# Patient Record
Sex: Female | Born: 1955 | Race: White | Hispanic: No | Smoking: Never smoker
Health system: Southern US, Community
[De-identification: ages and names within clinical notes are randomized; demographics above are authoritative.]

## PROBLEM LIST (undated history)

## (undated) DIAGNOSIS — M797 Fibromyalgia: Secondary | ICD-10-CM

## (undated) DIAGNOSIS — G4733 Obstructive sleep apnea (adult) (pediatric): Secondary | ICD-10-CM

## (undated) DIAGNOSIS — Z9989 Dependence on other enabling machines and devices: Secondary | ICD-10-CM

## (undated) DIAGNOSIS — A0472 Enterocolitis due to Clostridium difficile, not specified as recurrent: Secondary | ICD-10-CM

## (undated) DIAGNOSIS — M869 Osteomyelitis, unspecified: Secondary | ICD-10-CM

## (undated) HISTORY — PX: TOE DEBRIDEMENT: SHX1069

## (undated) HISTORY — PX: TEMPOROMANDIBULAR JOINT ARTHROPLASTY: SUR76

## (undated) HISTORY — PX: VAGINAL HYSTERECTOMY: SUR661

## (undated) HISTORY — PX: OTHER SURGICAL HISTORY: SHX169

## (undated) HISTORY — PX: BLADDER SUSPENSION: SHX72

## (undated) HISTORY — DX: Osteomyelitis, unspecified: M86.9

## (undated) HISTORY — DX: Enterocolitis due to Clostridium difficile, not specified as recurrent: A04.72

---

## 1986-01-23 HISTORY — PX: TMJ ARTHROPLASTY: SHX1066

## 2013-10-17 ENCOUNTER — Ambulatory Visit (INDEPENDENT_AMBULATORY_CARE_PROVIDER_SITE_OTHER): Payer: BC Managed Care – PPO | Admitting: Podiatry

## 2013-10-17 ENCOUNTER — Ambulatory Visit (INDEPENDENT_AMBULATORY_CARE_PROVIDER_SITE_OTHER): Payer: BC Managed Care – PPO

## 2013-10-17 ENCOUNTER — Encounter: Payer: Self-pay | Admitting: Podiatry

## 2013-10-17 VITALS — BP 111/69 | HR 60 | Resp 18

## 2013-10-17 DIAGNOSIS — R52 Pain, unspecified: Secondary | ICD-10-CM

## 2013-10-17 DIAGNOSIS — L089 Local infection of the skin and subcutaneous tissue, unspecified: Secondary | ICD-10-CM

## 2013-10-17 MED ORDER — CIPROFLOXACIN HCL 500 MG PO TABS: 500.0000 mg | ORAL_TABLET | Freq: Two times a day (BID) | ORAL | Status: DC

## 2013-10-17 MED ORDER — CLINDAMYCIN HCL 300 MG PO CAPS: 300.0000 mg | ORAL_CAPSULE | Freq: Three times a day (TID) | ORAL | Status: DC

## 2013-10-17 NOTE — Patient Instructions (Signed)
Monitor for any signs/symptoms of infection. Call the office immediately if any occur or go directly to the emergency room. Call with any questions/concerns. Continue antibiotics Get MRI, will call with results

## 2013-10-17 NOTE — Progress Notes (Signed)
   Subjective:    Patient ID: Carrie [name], female    DOB: 12-31-1955, 58 y.o.   MRN: [id number]  HPI Carrie [name], 58 year old female, presents to the office today with complaints of right hallux pain/infection. She states that approximately 9 weeks ago she was having pain on the inside aspect of her big toe on the right side. At that time she saw another podiatrist who performed a partial nail avulsion. A couple weeks after that she continued to have pain, swelling, redness. A repeat procedure was then performed. She states that over the last few weeks she has continued with redness, pain to the right big toe. She has been on 2 wound of antibiotics including Bactrim and doxycycline. She states that after the procedure she did have some purulent drainage. Also states that she had a "hole" on the inside aspect of her big toe which had some drainage. She states that while on antibiotics the redness, pain decreases but she comes off of the antibiotic he continues to become symptomatic. She has been soaking the foot as well as applying Neosporin to the site. Currently she denies any systemic complaints as fevers, chills, nausea, vomiting. No other complaints at this time.    Review of Systems  Skin: Positive for color change.       CHANGE IN NAILS  All other systems reviewed and are negative.      Objective:   Physical Exam AAO x3, NAD DP/PT pulses palpable 2/4 b/l. CRT < 3 sec Protective sensation intact with Simms Weinstein monofilament, vibratory sensation intact, Achilles tendon reflex intact. Right hallux status post partial nail avulsion with approximately 1/2 of the nail removed. There is mild edema to the digit with mild erythema from approximately the level of the IPJ distally.No fluctuance or crepitus. No ascending cellulitis. No drainage/purulece. Mild tenderness to palpation over the medial nail border and proximal nail border. Tenderness to palpation over the medial aspect of the hallux. No  open lesions. MMT 5/5, ROM No leg pain, swelling, warmth.     Assessment & Plan:  58 year old female with right hallux pain, persistent erythema status post partial nail avulsion. -X-rays were obtained and reviewed with the patient. See x-ray report for full details. There is some periosteal reaction on the medial aspect of the distal phalanx. There is a possible radiolucent line within the distal phalanx transversely at the level of the periosteal reaction. No prior x-ray to compare. -Conservative versus surgical treatment were discussed including alternatives, risks, complications. -Due to persistent pain, redness, edema with x-ray changes will obtain MRI to rule out osteomyelitis. -For now start clindamycin and ciprofloxacin - Monitor for any clinical signs or symptoms of worsening infection and directed to call the office immediately if any are to occur or go directly to the emergency room to -Followup after MRI. -Call with any questions, concerns, change in symptoms.

## 2013-10-31 ENCOUNTER — Telehealth: Payer: Self-pay | Admitting: *Deleted

## 2013-10-31 ENCOUNTER — Telehealth: Payer: Self-pay | Admitting: Podiatry

## 2013-10-31 NOTE — Telephone Encounter (Signed)
Attempted to call patient to discuss MRI results. Left message to call back. I will continue to try to reach her.

## 2013-10-31 NOTE — Telephone Encounter (Signed)
I am calling to see if I can get the results of my MRI that Dr. Ardelle AntonWagoner ordered.  Thank you.

## 2013-11-03 ENCOUNTER — Telehealth: Payer: Self-pay | Admitting: Podiatry

## 2013-11-03 NOTE — Telephone Encounter (Signed)
I called the patient on Saturday, 11/01/13 to discuss the results of the MRI. It does confirm osteomyelitis. Discussed with the patient the need for a bone biopsy. She is also at risk of amputation. Will schedule biopsy.   Today, 11/03/13, I spoke to Dr. Algis LimingVanDam, MD (Infectious Disease) in regards to the patient/MRI findings. Will discontinue antibiotics now, and schedule bone biopsy for next Monday, 11/10/13. Discussed with her that if she has signs/symptoms of infection to call immediately. Currently, she states her toe is stable however it fluctuates with some redness/warmth/pain, which corresponds to how much she is on it. [facility]. Patient to come in Wednesday, 11/05/13 at 4:30 to do consent. Will follow up with Infectious Disease after biopsy. Call sooner with any questions/concerns.

## 2013-11-03 NOTE — Telephone Encounter (Signed)
I'm calling about scheduling the biopsy of the toe.  Return my call, thank you.

## 2013-11-03 NOTE — Telephone Encounter (Signed)
I called and informed her that Dr. Ardelle AntonWagoner said he can do your surgery on Monday of next week.  He needs for you to schedule an appointment to come in for a consultation.  So if you can call tomorrow to schedule an appointment that would be great.  She stated, "Okay, I will."  I asked her if she has another number that we can reach her at because Dr.Wagoner had been trying to call you.  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

## 2013-11-03 NOTE — Telephone Encounter (Signed)
I need to schedule a biopsy on my toe as per Dr. Ardelle AntonWagoner.  Thank you.  I returned her call.  She stated, "I need to schedule my biopsy."  I asked her if it is going to be done here in the office or the surgical center.  She stated, "I don't know, he just told me he needed to do a biopsy because I have infection in my bone and he wanted to take a sample and based upon it, he may refer me to Infectious Disease.  So, I'd like to schedule it as soon as possible.  I told her I would have to check with Dr. Ardelle AntonWagoner to see where he would do the procedure, if it will be here or the surgical center and I would give her a call back.  She stated, "That is fine."

## 2013-11-05 ENCOUNTER — Encounter: Payer: Self-pay | Admitting: Podiatry

## 2013-11-05 ENCOUNTER — Ambulatory Visit (INDEPENDENT_AMBULATORY_CARE_PROVIDER_SITE_OTHER): Payer: BC Managed Care – PPO | Admitting: Podiatry

## 2013-11-05 VITALS — BP 111/59 | HR 63 | Resp 12

## 2013-11-05 DIAGNOSIS — M86171 Other acute osteomyelitis, right ankle and foot: Secondary | ICD-10-CM

## 2013-11-05 NOTE — Patient Instructions (Signed)
Pre-Operative Instructions  Congratulations, you have decided to take an important step to improving your quality of life.  You can be assured that the doctors of [facility] will be with you every step of the way.  1. Plan to be at the surgery center/hospital at least 1 (one) hour prior to your scheduled time unless otherwise directed by the surgical center/hospital staff.  You must have a responsible adult accompany you, remain during the surgery and drive you home.  Make sure you have directions to the surgical center/hospital and know how to get there on time. 2. For hospital based surgery you will need to obtain a history and physical form from your family physician within 1 month prior to the date of surgery- we will give you a form for you primary physician.  3. We make every effort to accommodate the date you request for surgery.  There are however, times where surgery dates or times have to be moved.  We will contact you as soon as possible if a change in schedule is required.   4. No Aspirin/Ibuprofen for one week before surgery.  If you are on aspirin, any non-steroidal anti-inflammatory medications (Mobic, Aleve, Ibuprofen) you should stop taking it 7 days prior to your surgery.  You make take Tylenol  For pain prior to surgery.  5. Medications- If you are taking daily heart and blood pressure medications, seizure, reflux, allergy, asthma, anxiety, pain or diabetes medications, make sure the surgery center/hospital is aware before the day of surgery so they may notify you which medications to take or avoid the day of surgery. 6. No food or drink after midnight the night before surgery unless directed otherwise by surgical center/hospital staff. 7. No alcoholic beverages 24 hours prior to surgery.  No smoking 24 hours prior to or 24 hours after surgery. 8. Wear loose pants or shorts- loose enough to fit over bandages, boots, and casts. 9. No slip on shoes, sneakers are best. 10. Bring  your boot with you to the surgery center/hospital.  Also bring crutches or a walker if your physician has prescribed it for you.  If you do not have this equipment, it will be provided for you after surgery. 11. If you have not been contracted by the surgery center/hospital by the day before your surgery, call to confirm the date and time of your surgery. 12. Leave-time from work may vary depending on the type of surgery you have.  Appropriate arrangements should be made prior to surgery with your employer. 13. Prescriptions will be provided immediately following surgery by your doctor.  Have these filled as soon as possible after surgery and take the medication as directed. 14. Remove nail polish on the operative foot. 15. Wash the night before surgery.  The night before surgery wash the foot and leg well with the antibacterial soap provided and water paying special attention to beneath the toenails and in between the toes.  Rinse thoroughly with water and dry well with a towel.  Perform this wash unless told not to do so by your physician.  Enclosed: 1 Ice pack (please put in freezer the night before surgery)   1 Hibiclens skin cleaner   Pre-op Instructions  If you have any questions regarding the instructions, do not hesitate to call our office.  [place]: [address] [place], [place] [address] [phone]  [place]: [address] [place], [place] [address] [phone]  [place]: [address]  [place], [place] [address] [phone]  Dr. Richard   Tuchman DPM, Dr. Norman Regal DPM Dr. Richard Sikora DPM, Dr. M. Todd Hyatt DPM, Dr. Kathryn Egerton DPM 

## 2013-11-06 ENCOUNTER — Encounter: Payer: Self-pay | Admitting: Podiatry

## 2013-11-06 ENCOUNTER — Telehealth: Payer: Self-pay | Admitting: *Deleted

## 2013-11-06 DIAGNOSIS — M86179 Other acute osteomyelitis, unspecified ankle and foot: Secondary | ICD-10-CM | POA: Insufficient documentation

## 2013-11-06 NOTE — Telephone Encounter (Signed)
I called and informed the patient that we were able to get her scheduled for surgery on Monday.  She stated, "I got an email from the surgical center that said to be there at 6 am.  So, I assume my surgery starts at 7 am."  I told her that is correct.

## 2013-11-06 NOTE — Telephone Encounter (Signed)
I called and spoke to Aram BeechamCynthia in regards to surgery.  Surgery will be for Monday at 7am at [facility].    I called and informed the patient.

## 2013-11-06 NOTE — Progress Notes (Signed)
Patient ID: Carrie [name], female   DOB: September 07, 1955, 58 y.o.   MRN: [id number]  Subjective: Carrie [name], 58 year old female, returns the office today for followup evaluation of right hallux pain, infection. She states that the redness and pain to the toe is intermittent. Since last appointment she did have an MRI performed of the right foot. She is also been continuing on clindamycin ciprofloxacin. She does state that she did finish the course of antibiotics approximately last Wednesday and has not been on antibiotics since. Today she does admit that she has been remodeling her house and she did step on a nail in July. She believes this is what started the infection. She denies any systemic complaints such as fevers, chills, nausea, vomiting. Denies any acute changes since last appointment. No other complaints at this time.  Objective: AAO x3, NAD DP/PT pulses palpable bilaterally, CRT less than 3 seconds Protective sensation intact with Simms Weinstein monofilament, vibratory sensation intact, Achilles tendon reflex intact. Tenderness to palpation of the right hallux mostly over the medial aspect distally. There is localized erythema over the digit starting approximately level of the IPJ distally. There is no streaking. Is no fluctuance, crepitus. No open lesions. No purulence or drainage identified. No calf pain, swelling, warmth. MMT 5/5, ROM WNL  MRI 10/25/13 (preformed at [facility])- Marrow edema throughout the distal phalanx of the great toe is consistent with osteomyelitis. There appears to be a skin ulceration along the medial aspect of the distal phalanx. Negative for abscess.  Assessment: 58 year old female with right hallux continued pain and erythema with x-rays and MRI concerning for osteomyelitis of the distal phalanx.  Plan: -Conservative versus surgical treatment were discussed including alternatives, risks, complications. -At this time given the MRI and x-ray findings consistent  with osteomyelitis recommend bone biopsy for definitive diagnosis and to help determine antibiotic treatment. I did discuss this case with Dr. Algis LimingVanDam of infectious disease. Once bone biopsy results are obtained we'll refer to infectious disease. -Discussed the surgery in detail with the patient including alternatives, risks, complications. I also discussed with her the postoperative course. Risks of performing a bone biopsy include further infection bleeding, pain, swelling, need for further surgery, painful or ugly scar, numbness or sensation changes, blood clots, fracture of the bone. I also discussed with the patient she is at possibility of toe amputation. At this time the patient wishes to proceed with anything to help prevent amputation. -Surgery will be performed on either Monday, October 19 or Tuesday, October 20 pending scheduling of the surgery center. [facility].  -Will continue to hold off on antibiotics this time due to bone biopsy. In the meantime if the area becomes more swollen, red, streaking or any systemic complaints to call the office.  -Patient followup 1 week after surgery. In the meantime call the office with any questions, concerns, change in symptoms.

## 2013-11-07 ENCOUNTER — Telehealth: Payer: Self-pay | Admitting: *Deleted

## 2013-11-07 NOTE — Telephone Encounter (Signed)
I called the patient back. I told her to go ahead and make the appointment with ID. I told her we may not have the results back by then but if they don't have an opening for a couple more weeks, it is best to go ahead and be seen. She will schedule for Wednesday with Dr. Daiva EvesVan Dam.

## 2013-11-07 NOTE — Telephone Encounter (Signed)
I'm scheduled for a bone biopsy on Monday.  I was called from Dr. Zenaida NieceVan Dam's Infectious Disease office, about an appointment on Wednesday.  I'm trying to find out if test results will be back for that to be a productive visit.  If someone would please give me a call back.

## 2013-11-10 ENCOUNTER — Encounter: Payer: Self-pay | Admitting: Podiatry

## 2013-11-10 DIAGNOSIS — M86171 Other acute osteomyelitis, right ankle and foot: Secondary | ICD-10-CM

## 2013-11-10 NOTE — Telephone Encounter (Signed)
I called and left her a message that Dr. Ardelle AntonWagoner said to go ahead and keep the appointment for Wednesday with infectious disease.  If you have any questions, give me a call back.

## 2013-11-12 ENCOUNTER — Encounter: Payer: Self-pay | Admitting: Infectious Disease

## 2013-11-12 ENCOUNTER — Ambulatory Visit (INDEPENDENT_AMBULATORY_CARE_PROVIDER_SITE_OTHER): Payer: BC Managed Care – PPO | Admitting: Infectious Disease

## 2013-11-12 ENCOUNTER — Telehealth: Payer: Self-pay | Admitting: Podiatry

## 2013-11-12 VITALS — BP 125/80 | HR 82 | Temp 98.4°F | Wt 136.5 lb

## 2013-11-12 DIAGNOSIS — Z23 Encounter for immunization: Secondary | ICD-10-CM | POA: Diagnosis not present

## 2013-11-12 DIAGNOSIS — Z113 Encounter for screening for infections with a predominantly sexual mode of transmission: Secondary | ICD-10-CM | POA: Diagnosis not present

## 2013-11-12 DIAGNOSIS — M86172 Other acute osteomyelitis, left ankle and foot: Secondary | ICD-10-CM | POA: Diagnosis not present

## 2013-11-12 LAB — BASIC METABOLIC PANEL WITH GFR
BUN: 9 mg/dL (ref 6–23)
CO2: 26 mEq/L (ref 19–32)
Calcium: 9.9 mg/dL (ref 8.4–10.5)
Chloride: 105 mEq/L (ref 96–112)
Creat: 0.78 mg/dL (ref 0.50–1.10)
GFR, EST NON AFRICAN AMERICAN: 85 mL/min
Glucose, Bld: 80 mg/dL (ref 70–99)
POTASSIUM: 4.1 meq/L (ref 3.5–5.3)
SODIUM: 143 meq/L (ref 135–145)

## 2013-11-12 LAB — CBC WITH DIFFERENTIAL/PLATELET
Basophils Absolute: 0 10*3/uL (ref 0.0–0.1)
Basophils Relative: 0 % (ref 0–1)
EOS ABS: 0.1 10*3/uL (ref 0.0–0.7)
EOS PCT: 1 % (ref 0–5)
HEMATOCRIT: 40.1 % (ref 36.0–46.0)
HEMOGLOBIN: 13.5 g/dL (ref 12.0–15.0)
Lymphocytes Relative: 20 % (ref 12–46)
Lymphs Abs: 1.5 10*3/uL (ref 0.7–4.0)
MCH: 29.2 pg (ref 26.0–34.0)
MCHC: 33.7 g/dL (ref 30.0–36.0)
MCV: 86.6 fL (ref 78.0–100.0)
MONO ABS: 0.5 10*3/uL (ref 0.1–1.0)
MONOS PCT: 6 % (ref 3–12)
NEUTROS ABS: 5.5 10*3/uL (ref 1.7–7.7)
Neutrophils Relative %: 73 % (ref 43–77)
Platelets: 279 10*3/uL (ref 150–400)
RBC: 4.63 MIL/uL (ref 3.87–5.11)
RDW: 14 % (ref 11.5–15.5)
WBC: 7.6 10*3/uL (ref 4.0–10.5)

## 2013-11-12 LAB — C-REACTIVE PROTEIN: CRP: <0.5 mg/dL (ref <0.60)

## 2013-11-12 LAB — HEPATITIS PANEL, ACUTE
HCV Ab: NEGATIVE
HEP A IGM: NONREACTIVE
Hep B C IgM: NONREACTIVE
Hepatitis B Surface Ag: NEGATIVE

## 2013-11-12 NOTE — Patient Instructions (Signed)
We are trying to track down cultures that may or may not have been done this past Friday  IF no cultures were obtained options:  #1 you can come off the antibiotics again and Dr Ardelle AntonWagoner can obtain fresh cultures 2 weeks from stopping them  Or  #2 we can place PICC line and start an "EMPIRIC' regimen for you that is NOT targetted  I have put call into your surgeon  Lets get blood work today, keep appt with your surgeon for Friday and we can then make game plan from there

## 2013-11-12 NOTE — Telephone Encounter (Signed)
Call patient to followup with her after her surgery on Monday. She states that she is feeling tired, but overall okay. No questions at this time.   She saw Dr. Daiva EvesVan Dam today.   There was a question about the results of the specimens and I called the [facility] and they confirmed they were sent to [facility]. I was told we should hopefully have results by the end of the week. Will send the results once they are received.

## 2013-11-12 NOTE — Progress Notes (Signed)
Subjective:    Patient ID: Carrie [name], female    DOB: March 29, 1955, 58 y.o.   MRN: [id number]  HPI  58 year old with toe injury (not sure mechanism thinks it was due to carpet tack maybe?). Was sore on outside. And underneath, toenail swollen. Turned red. Pt cut nail, went back, went to Dr. Hal Neerillis on August 17th.  He cut out toenail and put pt on abx , took for 7 days. Infection was worse. He removed nail more. Pt started bactrim DS BID. Pt went in and was not much improved. Pt "messed with scab pus and blood came out. Called [facility] back. Bactrim DS bid again which helped but recurred again off of the bactrim. Went on the 21st of September. 2 days later much worse. Pt then went to see Dr. Ardelle AntonWagoner. Dr. Ardelle AntonWagoner saw her did plain films suspected osteomyelitis and started cipro and clindamycin.  Pt had MRI in October which showed bone Marrow edema throughout the distal phalanx of the great toe is consistent with osteomyelitis Pt was off the clinda and cipro for nearly 2 weeks. Had surgery on Monday. Back on cipro and clindamycin.  Today initially when talked to Dr. Gabriel RungWagoner's clinic it appeared that cultures potential he might not have been sent. However I called Dr. Loreta AveWagner on his cell phone he said indeed culture and pathological exam specimens had indeed been sent but 2 different reference labs than [facility]      Review of Systems  Constitutional: Negative for chills, activity change and appetite change.  HENT: Negative for hearing loss, mouth sores, sinus pressure and sore throat.   Eyes: Negative for photophobia and visual disturbance.  Respiratory: Negative for choking, chest tightness, shortness of breath and wheezing.   Gastrointestinal: Negative for abdominal pain, constipation and abdominal distention.  Endocrine: Negative for cold intolerance.  Genitourinary: Negative for dysuria, flank pain and difficulty urinating.  Musculoskeletal: Negative for arthralgias and back pain.  Skin: Positive  for wound.  Neurological: Negative for dizziness and headaches.  Hematological: Negative for adenopathy. Does not bruise/bleed easily.  Psychiatric/Behavioral: Negative for behavioral problems, confusion, dysphoric mood, decreased concentration and agitation.       Objective:   Physical Exam  Constitutional: She appears well-developed and well-nourished.  HENT:  Head: Normocephalic and atraumatic.  Eyes: Conjunctivae and EOM are normal. No scleral icterus.  Neck: Normal range of motion. Neck supple.  Cardiovascular: Normal rate and regular rhythm.   Pulmonary/Chest: Effort normal. No respiratory distress. She has no wheezes.  Abdominal: Soft. She exhibits no distension. There is no tenderness.  Musculoskeletal: Normal range of motion. She exhibits no edema.  Neurological: She is alert.  Skin: She is not diaphoretic.  Psychiatric: She has a normal mood and affect. Her behavior is normal. Judgment and thought content normal.          Assessment & Plan:  Osteomyelitis of toe: Followup cultures done at Dr. Gabriel RungWagoner's office. Hopefully we can then target an organism with IV antibiotics if the organisms grown we will have to try an enteric regimen regimen we'll plan for 6 weeks' time. Will check basic labs today and also check sedimentation rate C-reactive protein as well as screening her for HIV and hepatitis panel. I spent greater than 45 minutes with the patient including greater than 50% of time in face to face counsel of the patient and in coordination of their care.   Screening we'll screen her for intravenous C. and other viral entities although she's not a  high risk mammographic   influenza shot provided

## 2013-11-13 LAB — HIV ANTIBODY (ROUTINE TESTING W REFLEX): HIV 1&2 Ab, 4th Generation: NONREACTIVE

## 2013-11-14 ENCOUNTER — Ambulatory Visit (INDEPENDENT_AMBULATORY_CARE_PROVIDER_SITE_OTHER): Payer: BC Managed Care – PPO | Admitting: Podiatry

## 2013-11-14 ENCOUNTER — Ambulatory Visit (INDEPENDENT_AMBULATORY_CARE_PROVIDER_SITE_OTHER): Payer: BC Managed Care – PPO

## 2013-11-14 ENCOUNTER — Encounter: Payer: Self-pay | Admitting: Podiatry

## 2013-11-14 VITALS — BP 119/69 | HR 65 | Temp 98.5°F | Resp 16

## 2013-11-14 DIAGNOSIS — M86171 Other acute osteomyelitis, right ankle and foot: Secondary | ICD-10-CM

## 2013-11-14 NOTE — Progress Notes (Signed)
Dr Ardelle AntonWagoner performed a right hallux bone biopsy on 11/10/13

## 2013-11-17 NOTE — Progress Notes (Signed)
Patient ID: Armen Pickupvelyn Cutrone, female   DOB: 06-03-1955, 58 y.o.   MRN: [id number]  Subjective: Ms. Ala DachFord returns to the office today 4 days status post right hallux bone biopsy for suspected osteomyelitis. She denies any systemic complaints of fevers, chills, nausea, vomiting. She has recently been evaluated by infectious disease. She is continued on clindamycin and ciprofloxacin pending results of the bone biopsy. She denies any acute changes of as appointed. Other complaints this time.  Objective: AAO x3, NAD DP/PT pulses palpable bilaterally, CRT less than 3 seconds Protective sensation intact with Simms Weinstein monofilament, vibratory sensation intact, Achilles tendon reflex intact Mild erythema to the distal aspect of the right hallux to the level of the IPJ. This is the area which has been unchanged since surgery. There is no streaking present. No areas of fluctuance or crepitus. Incision on the medial aspect as well as the distal aspect of the right hallux WITHOUT any evidence of dehiscence and sutures intact. No drainage. Mild tenderness strictly over the surgical site. Mild edema. No calf pain, swelling, warmth, erythema.  Assessment: 58 year old female 4 days status post bone biopsy right hallux for suspected osteomyelitis  Plan: -X-Rays were obtained and reviewed with the patient. -Treatment options discussed including alternatives, risks, complications. -Betadine painted over the incision followed by dry sterile dressing. Discussed with the patient that she can change the bandage at home to monitor the erythema and for any signs or symptoms of worsening infection. I discussed with her how to do this. Continue with surgical shoe. Discussed not to try with surgical shoe. -Follow-up with infectious disease for antibiotics. Will forward the results to Dr. Algis LimingVanDam once obtained.  -Follow-up in 2 weeks or sooner if there are any questions, concerns, any change in symptoms. At that time sutures  will be removed.

## 2013-11-21 ENCOUNTER — Telehealth: Payer: Self-pay | Admitting: Infectious Disease

## 2013-11-21 NOTE — Telephone Encounter (Signed)
Received faxed micro data from Dr. Ardelle AntonWagoner.  No growth on her back or in her gait plates pathology itself did not show osteomyelitis the MRI data.  Will need to talk to her early next week about if she would like to proceed with insertion of PICC line and aggressive course of IV antibiotics as we originally  Carrie [name] can you call her on Monday?

## 2013-11-23 HISTORY — PX: PERIPHERALLY INSERTED CENTRAL CATHETER INSERTION: SHX2221

## 2013-11-24 NOTE — Telephone Encounter (Signed)
She has an appointment with Dr. Ardelle AntonWagoner on Friday to have stitches removed, she will talk to him then.

## 2013-11-24 NOTE — Telephone Encounter (Signed)
If we were going to go with oral antibiotics then I think something to cover MRSA, Coag neg staph such as doxycyline 100mg  twice daily along with perhaps a fluoroquinolone such as Levaquin at 750mg  daily for at least 4 if not 6 weeks

## 2013-11-24 NOTE — Telephone Encounter (Signed)
I will see her this week and talk to her about it. If she does not want a PICC, what oral abx would you suggest? I had put her on clinda/cipro immediately after the biopsy. I did call her last week to let her know about the results but did not discuss treatment. I am surprised that she wanted to see what happens off of antibiotics because that was her complaint at first was when she was off antibiotics her toe would become red and swollen. I will discuss with her this week and let you know. Thanks for the update.

## 2013-11-24 NOTE — Telephone Encounter (Signed)
Patient would like to see if symptoms get better without antibiotics first and she has f/u appt with you on 12/21.

## 2013-11-24 NOTE — Telephone Encounter (Signed)
I do not think that is a good idea AT ALL.  I think she at minimum should be taking oral antibiotics. Is she at least taking those?  Also want to make sure Dr. Ardelle AntonWagoner is aware of what is going on here. Sounds like she may need a visit to review and discuss

## 2013-11-24 NOTE — Telephone Encounter (Signed)
I hope she is taking SOME of the oral antibiotics she was on when I last saw her

## 2013-11-26 ENCOUNTER — Encounter: Payer: Self-pay | Admitting: Podiatry

## 2013-11-26 ENCOUNTER — Ambulatory Visit (INDEPENDENT_AMBULATORY_CARE_PROVIDER_SITE_OTHER): Payer: BC Managed Care – PPO | Admitting: Podiatry

## 2013-11-26 VITALS — BP 119/59 | HR 60 | Temp 97.9°F | Resp 12

## 2013-11-26 DIAGNOSIS — M86171 Other acute osteomyelitis, right ankle and foot: Secondary | ICD-10-CM

## 2013-11-26 DIAGNOSIS — Z09 Encounter for follow-up examination after completed treatment for conditions other than malignant neoplasm: Secondary | ICD-10-CM

## 2013-11-26 NOTE — Progress Notes (Signed)
Patient ID: Armen Pickupvelyn Specht, female   DOB: [birth year], 58 y.o.   MRN: [id number]  Subjective: Ms. Ala DachFord returns the office today 2 weeks status post right hallux bone biopsy for suspected osteomyelitis. She states that she is continuing to have some pain overlying the area in the same spot that she had prior. She does that she finished her antibiotics which were clindamycin and ciprofloxacin on Saturday and has not been on any antibiotics since then. She states that on Sunday and she did notice that her toe became more red, swollen. She states that she was wearing to see what happens to her toe off of antibiotics before proceeding with any further treatment. She denies any recent injury or trauma to the area. She denies any systemic complaints as fevers, chills, nausea, vomiting. No acute changes since last appointment. No other complaints at this time.  Objective: AAO x3, NAD DP/PT pulses palpable bilaterally, CRT less than 3 seconds Protective sensation intact with Simms Weinstein monofilament, vibratory sensation intact Incision on the distal as well as the medial aspect of the right hallux is well coapted without any evidence of dehiscence and sutures intact. There continues to be mild erythema over the distal aspect of the right hallux starting at the level of the IPJ which is been consistent since prior to surgery. There is no significant increase in warmth over the area. There is mild overlying edema. There is no drainage appearance identified. No open lesions. No pain with range of motion at the MPJ. Mild tenderness over the medial aspect of the distal phalanx. No calf pain, swelling, warmth, erythema.  Assessment: 58 year old female continued right hallux pain, swelling, erythema  Plan: Treatment options were discussed with the patient including alternatives, risks, complications. She states she wanted to see what happened while off the antibiotics. Also discussed with her the results of the biopsy  as well as the culture data.I discussed with her in great detail that she is at risk of losing at least portion of of the toe due to the suspected osteomyelitis, and if she goes without treatment there is risk of the infection spreading and that she can lose all of the toe or even more proximal. She states that she went to see how the toe looked today before starting any further antibiotics. She does state that today the toe does continue to be red, swollen, painful and she wishes to proceed with antibiotics at this time. I did discuss with her various options and she has elected to proceed with a PICC line and intravenous antibiotics as she has previous been on oral antibiotics without any resolution in symptoms. I'll discuss this with Dr. Daiva EvesVan Dam. -Sutures are removed today. Steri-Strips applied. Discussed with the patient she can start to get the area wet however do not soak the foot.Marland Kitchen. Apply a small amount of antibiotic ointment over the incision followed by a light dressing. Continue with surgical shoe until next appointment. -follow-up in 2 weeks or sooner if any problems are to arise or any change in symptoms. Will re-x-ray at next appointment

## 2013-11-27 ENCOUNTER — Other Ambulatory Visit: Payer: Self-pay | Admitting: Licensed Clinical Social Worker

## 2013-11-27 DIAGNOSIS — M86172 Other acute osteomyelitis, left ankle and foot: Secondary | ICD-10-CM

## 2013-11-27 NOTE — Progress Notes (Signed)
Order placed in epic,[facility] just waiting on phone call back with time and date

## 2013-11-28 ENCOUNTER — Telehealth (HOSPITAL_COMMUNITY): Payer: Self-pay | Admitting: Interventional Radiology

## 2013-11-28 NOTE — Telephone Encounter (Signed)
Called pt, left VM for her to call to schedule PICC placement JM

## 2013-12-01 NOTE — Progress Notes (Signed)
I set this patient for this Wednesday for PICC Placement and [facility] has been notified to furnish antibiotics

## 2013-12-02 ENCOUNTER — Other Ambulatory Visit: Payer: Self-pay | Admitting: Licensed Clinical Social Worker

## 2013-12-02 DIAGNOSIS — M869 Osteomyelitis, unspecified: Secondary | ICD-10-CM

## 2013-12-02 MED ORDER — DEXTROSE 5 % IV SOLN: 2.0000 g | Freq: Once | INTRAVENOUS | Status: DC

## 2013-12-02 MED ORDER — VANCOMYCIN HCL 10 G IV SOLR: 1250.0000 mg | Freq: Once | INTRAVENOUS | Status: DC

## 2013-12-03 ENCOUNTER — Ambulatory Visit (HOSPITAL_COMMUNITY)
Admission: RE | Admit: 2013-12-03 | Discharge: 2013-12-03 | Disposition: A | Discharge status: Home / Self Care | Payer: BC Managed Care – PPO | Source: Ambulatory Visit | Attending: Infectious Disease | Admitting: Infectious Disease

## 2013-12-03 ENCOUNTER — Other Ambulatory Visit: Payer: Self-pay | Admitting: Infectious Disease

## 2013-12-03 DIAGNOSIS — M86172 Other acute osteomyelitis, left ankle and foot: Secondary | ICD-10-CM

## 2013-12-03 DIAGNOSIS — M869 Osteomyelitis, unspecified: Secondary | ICD-10-CM | POA: Insufficient documentation

## 2013-12-03 MED ORDER — HEPARIN SOD (PORK) LOCK FLUSH 100 UNIT/ML IV SOLN
INTRAVENOUS | Status: AC
  Filled 2013-12-03: qty 5

## 2013-12-03 MED ORDER — LIDOCAINE HCL 1 % IJ SOLN
INTRAMUSCULAR | Status: AC
  Filled 2013-12-03: qty 20

## 2013-12-03 NOTE — Procedures (Signed)
Interventional Radiology Procedure Note  Procedure: Placement of a right brachial vein PICC.  Single lumen.  35cm. Tip at cavoatrial junction. Complications: No immediate Recommendations:  - Ok to shower tomorrow - Do not submerge for 7 days - Routine line care   Signed,  Yvone NeuJaime S. Loreta AveWagner, DO

## 2013-12-04 ENCOUNTER — Other Ambulatory Visit: Payer: Self-pay | Admitting: Licensed Clinical Social Worker

## 2013-12-04 ENCOUNTER — Non-Acute Institutional Stay (HOSPITAL_COMMUNITY)
Admission: AD | Admit: 2013-12-04 | Discharge: 2013-12-04 | Disposition: A | Discharge status: Home / Self Care | Payer: BC Managed Care – PPO | Source: Ambulatory Visit | Attending: Infectious Disease | Admitting: Infectious Disease

## 2013-12-04 ENCOUNTER — Encounter (HOSPITAL_COMMUNITY): Payer: BC Managed Care – PPO

## 2013-12-04 DIAGNOSIS — M86179 Other acute osteomyelitis, unspecified ankle and foot: Secondary | ICD-10-CM | POA: Diagnosis not present

## 2013-12-04 MED ORDER — VANCOMYCIN HCL 10 G IV SOLR
1250.0000 mg | INTRAVENOUS | Status: DC
  Administered 2013-12-04: 1250 mg via INTRAVENOUS
  Filled 2013-12-04 (×2): qty 1250

## 2013-12-04 MED ORDER — CEFTRIAXONE SODIUM IN DEXTROSE 40 MG/ML IV SOLN
2.0000 g | INTRAVENOUS | Status: DC
Start: 2013-12-04 — End: 2013-12-05
  Administered 2013-12-04: 2 g via INTRAVENOUS
  Filled 2013-12-04 (×2): qty 50

## 2013-12-04 MED ORDER — SODIUM CHLORIDE 0.9 % IJ SOLN
10.0000 mL | INTRAMUSCULAR | Status: AC | PRN
  Administered 2013-12-04: 10 mL

## 2013-12-04 MED ORDER — HEPARIN SOD (PORK) LOCK FLUSH 100 UNIT/ML IV SOLN
250.0000 [IU] | INTRAVENOUS | Status: AC | PRN
  Administered 2013-12-04: 250 [IU]
  Filled 2013-12-04: qty 5

## 2013-12-04 NOTE — Progress Notes (Signed)
Pt arrived for antibiotics transfusion x2; a/o; PICC accessed; antibiotics transfused per order; Pt tolerated well; Stable at discharge; PICC heparin locked; Discharged to home; Osvaldo HumanPaolillo, Anayia Eugene,RN

## 2013-12-09 ENCOUNTER — Encounter (HOSPITAL_COMMUNITY): Payer: Self-pay | Admitting: Emergency Medicine

## 2013-12-09 ENCOUNTER — Inpatient Hospital Stay (HOSPITAL_COMMUNITY)
Admission: EM | Admit: 2013-12-09 | Discharge: 2013-12-13 | DRG: 872 | Disposition: A | Discharge status: Home Health | Payer: BC Managed Care – PPO | Attending: Internal Medicine | Admitting: Internal Medicine

## 2013-12-09 DIAGNOSIS — A047 Enterocolitis due to Clostridium difficile: Secondary | ICD-10-CM | POA: Diagnosis present

## 2013-12-09 DIAGNOSIS — Z9889 Other specified postprocedural states: Secondary | ICD-10-CM

## 2013-12-09 DIAGNOSIS — G4733 Obstructive sleep apnea (adult) (pediatric): Secondary | ICD-10-CM | POA: Diagnosis present

## 2013-12-09 DIAGNOSIS — M791 Myalgia: Secondary | ICD-10-CM | POA: Diagnosis present

## 2013-12-09 DIAGNOSIS — R51 Headache: Secondary | ICD-10-CM | POA: Diagnosis present

## 2013-12-09 DIAGNOSIS — L0390 Cellulitis, unspecified: Secondary | ICD-10-CM | POA: Diagnosis present

## 2013-12-09 DIAGNOSIS — A414 Sepsis due to anaerobes: Secondary | ICD-10-CM | POA: Diagnosis not present

## 2013-12-09 DIAGNOSIS — Z888 Allergy status to other drugs, medicaments and biological substances status: Secondary | ICD-10-CM

## 2013-12-09 DIAGNOSIS — A0472 Enterocolitis due to Clostridium difficile, not specified as recurrent: Secondary | ICD-10-CM | POA: Insufficient documentation

## 2013-12-09 DIAGNOSIS — M86672 Other chronic osteomyelitis, left ankle and foot: Secondary | ICD-10-CM | POA: Diagnosis present

## 2013-12-09 DIAGNOSIS — R509 Fever, unspecified: Secondary | ICD-10-CM | POA: Diagnosis present

## 2013-12-09 DIAGNOSIS — B999 Unspecified infectious disease: Secondary | ICD-10-CM

## 2013-12-09 DIAGNOSIS — A419 Sepsis, unspecified organism: Secondary | ICD-10-CM | POA: Diagnosis not present

## 2013-12-09 DIAGNOSIS — M869 Osteomyelitis, unspecified: Secondary | ICD-10-CM | POA: Diagnosis present

## 2013-12-09 DIAGNOSIS — Z792 Long term (current) use of antibiotics: Secondary | ICD-10-CM

## 2013-12-09 DIAGNOSIS — I959 Hypotension, unspecified: Secondary | ICD-10-CM | POA: Diagnosis present

## 2013-12-09 DIAGNOSIS — Z79899 Other long term (current) drug therapy: Secondary | ICD-10-CM

## 2013-12-09 HISTORY — DX: Obstructive sleep apnea (adult) (pediatric): G47.33

## 2013-12-09 HISTORY — DX: Fibromyalgia: M79.7

## 2013-12-09 HISTORY — DX: Dependence on other enabling machines and devices: Z99.89

## 2013-12-09 LAB — CBC WITH DIFFERENTIAL/PLATELET
BASOS ABS: 0 10*3/uL (ref 0.0–0.1)
BASOS PCT: 0 % (ref 0–1)
EOS ABS: 0 10*3/uL (ref 0.0–0.7)
Eosinophils Relative: 0 % (ref 0–5)
HCT: 34 % — ABNORMAL LOW (ref 36.0–46.0)
Hemoglobin: 11.4 g/dL — ABNORMAL LOW (ref 12.0–15.0)
Lymphocytes Relative: 6 % — ABNORMAL LOW (ref 12–46)
Lymphs Abs: 0.6 10*3/uL — ABNORMAL LOW (ref 0.7–4.0)
MCH: 28.9 pg (ref 26.0–34.0)
MCHC: 33.5 g/dL (ref 30.0–36.0)
MCV: 86.3 fL (ref 78.0–100.0)
MONO ABS: 0.8 10*3/uL (ref 0.1–1.0)
Monocytes Relative: 7 % (ref 3–12)
Neutro Abs: 9 10*3/uL — ABNORMAL HIGH (ref 1.7–7.7)
Neutrophils Relative %: 87 % — ABNORMAL HIGH (ref 43–77)
Platelets: 179 10*3/uL (ref 150–400)
RBC: 3.94 MIL/uL (ref 3.87–5.11)
RDW: 13.1 % (ref 11.5–15.5)
WBC: 10.4 10*3/uL (ref 4.0–10.5)

## 2013-12-09 LAB — I-STAT CG4 LACTIC ACID, ED: Lactic Acid, Venous: 1 mmol/L (ref 0.5–2.2)

## 2013-12-09 MED ORDER — ACETAMINOPHEN 325 MG PO TABS
650.0000 mg | ORAL_TABLET | Freq: Four times a day (QID) | ORAL | Status: DC | PRN
  Administered 2013-12-09 – 2013-12-10 (×2): 650 mg via ORAL
  Filled 2013-12-09 (×2): qty 2

## 2013-12-09 MED ORDER — ONDANSETRON 4 MG PO TBDP
8.0000 mg | ORAL_TABLET | Freq: Once | ORAL | Status: AC
  Administered 2013-12-09: 8 mg via ORAL
  Filled 2013-12-09: qty 2

## 2013-12-09 NOTE — ED Notes (Signed)
The patient came in due to having a fever of 102.8.  The patient did not take anything for the fever.   The patient has a 99.0 temperature here and is actively throwing up.  The patient is also complaining of being achy.  She does have a pic line because she is getting treated for a toe with osteomyelitis.

## 2013-12-10 ENCOUNTER — Emergency Department (HOSPITAL_COMMUNITY): Payer: BC Managed Care – PPO

## 2013-12-10 ENCOUNTER — Encounter (HOSPITAL_COMMUNITY): Payer: Self-pay | Admitting: Emergency Medicine

## 2013-12-10 DIAGNOSIS — A419 Sepsis, unspecified organism: Secondary | ICD-10-CM

## 2013-12-10 DIAGNOSIS — M86672 Other chronic osteomyelitis, left ankle and foot: Secondary | ICD-10-CM | POA: Diagnosis present

## 2013-12-10 DIAGNOSIS — Z9889 Other specified postprocedural states: Secondary | ICD-10-CM | POA: Diagnosis not present

## 2013-12-10 DIAGNOSIS — M869 Osteomyelitis, unspecified: Secondary | ICD-10-CM

## 2013-12-10 DIAGNOSIS — G4733 Obstructive sleep apnea (adult) (pediatric): Secondary | ICD-10-CM | POA: Diagnosis present

## 2013-12-10 DIAGNOSIS — M791 Myalgia: Secondary | ICD-10-CM | POA: Diagnosis present

## 2013-12-10 DIAGNOSIS — R197 Diarrhea, unspecified: Secondary | ICD-10-CM | POA: Diagnosis not present

## 2013-12-10 DIAGNOSIS — R5381 Other malaise: Secondary | ICD-10-CM

## 2013-12-10 DIAGNOSIS — A047 Enterocolitis due to Clostridium difficile: Secondary | ICD-10-CM | POA: Diagnosis present

## 2013-12-10 DIAGNOSIS — Z888 Allergy status to other drugs, medicaments and biological substances status: Secondary | ICD-10-CM | POA: Diagnosis not present

## 2013-12-10 DIAGNOSIS — L0390 Cellulitis, unspecified: Secondary | ICD-10-CM | POA: Diagnosis present

## 2013-12-10 DIAGNOSIS — R509 Fever, unspecified: Secondary | ICD-10-CM

## 2013-12-10 DIAGNOSIS — Z79899 Other long term (current) drug therapy: Secondary | ICD-10-CM | POA: Diagnosis not present

## 2013-12-10 DIAGNOSIS — R51 Headache: Secondary | ICD-10-CM | POA: Diagnosis present

## 2013-12-10 DIAGNOSIS — I959 Hypotension, unspecified: Secondary | ICD-10-CM | POA: Diagnosis present

## 2013-12-10 DIAGNOSIS — A414 Sepsis due to anaerobes: Secondary | ICD-10-CM | POA: Diagnosis present

## 2013-12-10 DIAGNOSIS — Z792 Long term (current) use of antibiotics: Secondary | ICD-10-CM | POA: Diagnosis not present

## 2013-12-10 LAB — POCT I-STAT, CHEM 8
BUN: 10 mg/dL (ref 6–23)
CALCIUM ION: 1.15 mmol/L (ref 1.12–1.23)
Chloride: 102 mEq/L (ref 96–112)
Creatinine, Ser: 0.7 mg/dL (ref 0.50–1.10)
GLUCOSE: 120 mg/dL — AB (ref 70–99)
HCT: 39 % (ref 36.0–46.0)
Hemoglobin: 13.3 g/dL (ref 12.0–15.0)
Potassium: 3.7 mEq/L (ref 3.7–5.3)
Sodium: 137 mEq/L (ref 137–147)
TCO2: 21 mmol/L (ref 0–100)

## 2013-12-10 LAB — URINALYSIS, ROUTINE W REFLEX MICROSCOPIC
BILIRUBIN URINE: NEGATIVE
Glucose, UA: NEGATIVE mg/dL
HGB URINE DIPSTICK: NEGATIVE
KETONES UR: 15 mg/dL — AB
Leukocytes, UA: NEGATIVE
Nitrite: NEGATIVE
PROTEIN: NEGATIVE mg/dL
UROBILINOGEN UA: 0.2 mg/dL (ref 0.0–1.0)
pH: 6.5 (ref 5.0–8.0)

## 2013-12-10 LAB — CBC
HCT: 33.2 % — ABNORMAL LOW (ref 36.0–46.0)
Hemoglobin: 11.1 g/dL — ABNORMAL LOW (ref 12.0–15.0)
MCH: 29.8 pg (ref 26.0–34.0)
MCHC: 33.4 g/dL (ref 30.0–36.0)
MCV: 89 fL (ref 78.0–100.0)
Platelets: 159 10*3/uL (ref 150–400)
RBC: 3.73 MIL/uL — ABNORMAL LOW (ref 3.87–5.11)
RDW: 13.2 % (ref 11.5–15.5)
WBC: 8.6 10*3/uL (ref 4.0–10.5)

## 2013-12-10 LAB — VANCOMYCIN, RANDOM: VANCOMYCIN RM: 15.3 ug/mL

## 2013-12-10 LAB — LACTIC ACID, PLASMA: Lactic Acid, Venous: 1 mmol/L (ref 0.5–2.2)

## 2013-12-10 LAB — COMPREHENSIVE METABOLIC PANEL
ALT: 17 U/L (ref 0–35)
AST: 23 U/L (ref 0–37)
Albumin: 3.2 g/dL — ABNORMAL LOW (ref 3.5–5.2)
Alkaline Phosphatase: 79 U/L (ref 39–117)
Anion gap: 13 (ref 5–15)
BUN: 9 mg/dL (ref 6–23)
CO2: 23 mEq/L (ref 19–32)
Calcium: 8.8 mg/dL (ref 8.4–10.5)
Chloride: 106 mEq/L (ref 96–112)
Creatinine, Ser: 0.75 mg/dL (ref 0.50–1.10)
GFR calc Af Amer: >90 mL/min (ref >90)
GFR calc non Af Amer: >90 mL/min (ref >90)
Glucose, Bld: 106 mg/dL — ABNORMAL HIGH (ref 70–99)
Potassium: 3.7 mEq/L (ref 3.7–5.3)
Sodium: 142 mEq/L (ref 137–147)
Total Bilirubin: 0.6 mg/dL (ref 0.3–1.2)
Total Protein: 6.1 g/dL (ref 6.0–8.3)

## 2013-12-10 LAB — INFLUENZA PANEL BY PCR (TYPE A & B)
H1N1 flu by pcr: NOT DETECTED
Influenza A By PCR: NEGATIVE
Influenza B By PCR: NEGATIVE

## 2013-12-10 LAB — PROTIME-INR
INR: 1.37 (ref 0.00–1.49)
Prothrombin Time: 17 seconds — ABNORMAL HIGH (ref 11.6–15.2)

## 2013-12-10 LAB — C-REACTIVE PROTEIN: CRP: 3.2 mg/dL — ABNORMAL HIGH (ref <0.60)

## 2013-12-10 LAB — STREP PNEUMONIAE URINARY ANTIGEN: STREP PNEUMO URINARY ANTIGEN: NEGATIVE

## 2013-12-10 LAB — SEDIMENTATION RATE: SED RATE: 12 mm/h (ref 0–22)

## 2013-12-10 MED ORDER — SODIUM CHLORIDE 0.9 % IV SOLN
INTRAVENOUS | Status: DC
  Administered 2013-12-10 – 2013-12-13 (×3): via INTRAVENOUS

## 2013-12-10 MED ORDER — ACETAMINOPHEN 650 MG RE SUPP: 650.0000 mg | Freq: Four times a day (QID) | RECTAL | Status: DC | PRN

## 2013-12-10 MED ORDER — VANCOMYCIN HCL IN DEXTROSE 750-5 MG/150ML-% IV SOLN
750.0000 mg | Freq: Two times a day (BID) | INTRAVENOUS | Status: DC
  Administered 2013-12-10 – 2013-12-12 (×4): 750 mg via INTRAVENOUS
  Filled 2013-12-10 (×5): qty 150

## 2013-12-10 MED ORDER — SODIUM CHLORIDE 0.9 % IV BOLUS (SEPSIS)
1000.0000 mL | Freq: Once | INTRAVENOUS | Status: AC
  Administered 2013-12-10: 1000 mL via INTRAVENOUS

## 2013-12-10 MED ORDER — PIPERACILLIN-TAZOBACTAM 3.375 G IVPB
3.3750 g | Freq: Three times a day (TID) | INTRAVENOUS | Status: DC
  Administered 2013-12-10 – 2013-12-12 (×7): 3.375 g via INTRAVENOUS
  Filled 2013-12-10 (×9): qty 50

## 2013-12-10 MED ORDER — HEPARIN SODIUM (PORCINE) 5000 UNIT/ML IJ SOLN
5000.0000 [IU] | Freq: Three times a day (TID) | INTRAMUSCULAR | Status: DC
  Administered 2013-12-10 – 2013-12-13 (×6): 5000 [IU] via SUBCUTANEOUS
  Filled 2013-12-10 (×10): qty 1

## 2013-12-10 MED ORDER — SODIUM CHLORIDE 0.9 % IJ SOLN: 3.0000 mL | Freq: Two times a day (BID) | INTRAMUSCULAR | Status: DC

## 2013-12-10 MED ORDER — ACETAMINOPHEN 325 MG PO TABS
650.0000 mg | ORAL_TABLET | Freq: Four times a day (QID) | ORAL | Status: DC | PRN
  Administered 2013-12-10 – 2013-12-12 (×4): 650 mg via ORAL
  Filled 2013-12-10 (×4): qty 2

## 2013-12-10 MED ORDER — VANCOMYCIN HCL IN DEXTROSE 1-5 GM/200ML-% IV SOLN
1000.0000 mg | Freq: Once | INTRAVENOUS | Status: AC
  Administered 2013-12-10: 1000 mg via INTRAVENOUS
  Filled 2013-12-10: qty 200

## 2013-12-10 MED ORDER — CEFTRIAXONE SODIUM IN DEXTROSE 40 MG/ML IV SOLN
2.0000 g | Freq: Every day | INTRAVENOUS | Status: DC
  Filled 2013-12-10: qty 50

## 2013-12-10 MED ORDER — ONDANSETRON HCL 4 MG/2ML IJ SOLN: 4.0000 mg | Freq: Four times a day (QID) | INTRAMUSCULAR | Status: DC | PRN

## 2013-12-10 MED ORDER — CIPROFLOXACIN IN D5W 400 MG/200ML IV SOLN
400.0000 mg | Freq: Two times a day (BID) | INTRAVENOUS | Status: DC
  Administered 2013-12-10: 400 mg via INTRAVENOUS
  Filled 2013-12-10 (×2): qty 200

## 2013-12-10 MED ORDER — KETOROLAC TROMETHAMINE 30 MG/ML IJ SOLN
30.0000 mg | Freq: Once | INTRAMUSCULAR | Status: AC
  Administered 2013-12-10: 30 mg via INTRAVENOUS
  Filled 2013-12-10: qty 1

## 2013-12-10 MED ORDER — ONDANSETRON HCL 4 MG PO TABS: 4.0000 mg | ORAL_TABLET | Freq: Four times a day (QID) | ORAL | Status: DC | PRN

## 2013-12-10 NOTE — Progress Notes (Signed)
Patient temperature 101.2 orally. Blood cultures already drawn 11/17. Craige CottaKirby, NP notified. No new orders placed. Patient given 650mg  tylenol PO prn. WIll continue to monitor.

## 2013-12-10 NOTE — Progress Notes (Addendum)
Patient seen and examined. Admitted after midnight secondary to sepsis (presumed osteomyelitis; other considerations includes viral syndrome or even bacteremia; had a recent PICC line placed). Patient continue to be febrile and with generalized malaise. Her right toe (area with osteomyelitis) is slightly red and swollen, but according to patient looking a lot better than before.  No discharges. Please referred to Dr. Clyde LundborgNiu H&P for further info/details on admission.  Plan -Id consulted; will follow rec's -advance diet as tolerated -continue PRN antipyretics -will follow blood cx's (if positive will d/c PICC) -will check for influenza   Vassie LollMadera, Dessire Grimes [phone]

## 2013-12-10 NOTE — Consult Note (Signed)
[facility]  Total days of antibiotics 2        Day 2 vanco        Day 2 piptazo        Day 2 cipro       Reason for Consult: fever, malaise, myalgia    Referring Physician: Dyann Kief  Principal Problem:   Sepsis Active Problems:   Osteomyelitis   Fever   Obstructive sleep apnea    HPI: Carrie [name] is a 58 y.o. female  With osteomyelitis of right hallux, biospied on 10/20 initially placed on clinda and cipro oral without much improvement. She was seen by Dr. Tommy Medal in [place] [facility] in late Oct in addition to her podiatrist, Dr. Paulino Door who are managing her osteomyelitis. She had picc line placed on 11/11 for IV vanco and ctx for her osteomyelitis of right foot. She started to have malaise, myalgia, and fevers x 2 days thus came to ED on 11/17 for evaluation. She was found to be febrile to 110/6 slight leukocytosis of 10.4 with left shift, mild hypotension [id number]. She had cultures, cxr clean, picc line site unimpressive, and foot xray unremarkable. She was started on vanco and piptazo and cipro as expanded coverage. 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 She lives by herself. No other sick contacts.   Past Medical History  Diagnosis Date  . Osteomyelitis of toe   . Obstructive sleep apnea 12/10/2013    Allergies:  Allergies  Allergen Reactions  . Phenazopyridine Other (See Comments)    Racing heart, pass out  . Pyridium [Phenazopyridine Hcl] Other (See Comments)    Racing heart, passing out    MEDICATIONS: . heparin  5,000 Units Subcutaneous 3 times per day  . piperacillin-tazobactam (ZOSYN)  IV  3.375 g Intravenous 3 times per day  . sodium chloride  3 mL Intravenous Q12H    History  Substance Use Topics  . Smoking status: Never Smoker   . Smokeless tobacco: Never Used  . Alcohol Use: No    History reviewed. No pertinent family history.   Review  of Systems  Constitutional: Negative for fever, chills, diaphoresis, activity change, appetite change, fatigue and unexpected weight change.  HENT: Negative for congestion, sore throat, rhinorrhea, sneezing, trouble swallowing and sinus pressure.  Eyes: Negative for photophobia and visual disturbance.  Respiratory: Negative for cough, chest tightness, shortness of breath, wheezing and stridor.  Cardiovascular: Negative for chest pain, palpitations and leg swelling.  Gastrointestinal: Negative for nausea, vomiting, abdominal pain, diarrhea, constipation, blood in stool, abdominal distention and anal bleeding.  Genitourinary: Negative for dysuria, hematuria, flank pain and difficulty urinating.  Musculoskeletal: Negative for myalgias, back pain, joint swelling, arthralgias and gait problem.  Skin: Negative for color change, pallor, rash and wound.  Neurological: Negative for dizziness, tremors, weakness and light-headedness.  Hematological: Negative for adenopathy. Does not bruise/bleed easily.  Psychiatric/Behavioral: Negative for behavioral problems, confusion, sleep disturbance, dysphoric mood, decreased concentration and agitation.     OBJECTIVE: Temp:  [98.5 F (36.9 C)-100.6 F (38.1 C)] 100.1 F (37.8 C) (11/18 0927) Pulse Rate:  [94-119] 107 (11/18 0927) Resp:  [14-24] 18 (11/18 0616) BP: (78-124)/(45-71) 100/57 mmHg (11/18 0927) SpO2:  [93 %-100 %] 98 % (11/18 0927) Weight:  [130 lb (58.968 kg)-137 lb 8 oz (62.37 kg)] 137 lb 8 oz (62.37 kg) (11/18 0093) Physical Exam  Constitutional:  oriented to person, place,  and time. appears well-developed and well-nourished. No distress.  HENT:  Mouth/Throat: Oropharynx is clear and moist. No oropharyngeal exudate.  Cardiovascular: Normal rate, regular rhythm and normal heart sounds. Exam reveals no gallop and no friction rub.  No murmur heard.  Pulmonary/Chest: Effort normal and breath sounds normal. No respiratory distress.  has no  wheezes.  Abdominal: Soft. Bowel sounds are normal.  exhibits no distension. There is no tenderness.  Lymphadenopathy: no cervical adenopathy.  Neurological: alert and oriented to person, place, and time.  Skin: Skin is warm and dry. No rash noted. No erythema.  Psychiatric: a normal mood and affect.  behavior is normal.   LABS: Results for orders placed or performed during the hospital encounter of 12/09/13 (from the past 48 hour(s))  CBC with Differential     Status: Abnormal   Collection Time: 12/09/13  9:23 PM  Result Value Ref Range   WBC 10.4 4.0 - 10.5 K/uL   RBC 3.94 3.87 - 5.11 MIL/uL   Hemoglobin 11.4 (L) 12.0 - 15.0 g/dL   HCT 34.0 (L) 36.0 - 46.0 %   MCV 86.3 78.0 - 100.0 fL   MCH 28.9 26.0 - 34.0 pg   MCHC 33.5 30.0 - 36.0 g/dL   RDW 13.1 11.5 - 15.5 %   Platelets 179 150 - 400 K/uL   Neutrophils Relative % 87 (H) 43 - 77 %   Neutro Abs 9.0 (H) 1.7 - 7.7 K/uL   Lymphocytes Relative 6 (L) 12 - 46 %   Lymphs Abs 0.6 (L) 0.7 - 4.0 K/uL   Monocytes Relative 7 3 - 12 %   Monocytes Absolute 0.8 0.1 - 1.0 K/uL   Eosinophils Relative 0 0 - 5 %   Eosinophils Absolute 0.0 0.0 - 0.7 K/uL   Basophils Relative 0 0 - 1 %   Basophils Absolute 0.0 0.0 - 0.1 K/uL  I-Stat CG4 Lactic Acid, ED     Status: None   Collection Time: 12/09/13  9:34 PM  Result Value Ref Range   Lactic Acid, Venous 1.00 0.5 - 2.2 mmol/L  Urinalysis, Routine w reflex microscopic     Status: Abnormal   Collection Time: 12/10/13 12:01 AM  Result Value Ref Range   Color, Urine YELLOW YELLOW   APPearance CLEAR CLEAR   Specific Gravity, Urine <1.005 (L) 1.005 - 1.030   pH 6.5 5.0 - 8.0   Glucose, UA NEGATIVE NEGATIVE mg/dL   Hgb urine dipstick NEGATIVE NEGATIVE   Bilirubin Urine NEGATIVE NEGATIVE   Ketones, ur 15 (A) NEGATIVE mg/dL   Protein, ur NEGATIVE NEGATIVE mg/dL   Urobilinogen, UA 0.2 0.0 - 1.0 mg/dL   Nitrite NEGATIVE NEGATIVE   Leukocytes, UA NEGATIVE NEGATIVE    Comment: MICROSCOPIC NOT DONE  ON URINES WITH NEGATIVE PROTEIN, BLOOD, LEUKOCYTES, NITRITE, OR GLUCOSE <1000 mg/dL.  I-STAT, chem 8     Status: Abnormal   Collection Time: 12/10/13 12:01 AM  Result Value Ref Range   Sodium 137 137 - 147 mEq/L   Potassium 3.7 3.7 - 5.3 mEq/L   Chloride 102 96 - 112 mEq/L   BUN 10 6 - 23 mg/dL   Creatinine, Ser 0.70 0.50 - 1.10 mg/dL   Glucose, Bld 120 (H) 70 - 99 mg/dL   Calcium, Ion 1.15 1.12 - 1.23 mmol/L   TCO2 21 0 - 100 mmol/L   Hemoglobin 13.3 12.0 - 15.0 g/dL   HCT 39.0 36.0 - 46.0 %  Comprehensive metabolic panel     Status:  Abnormal   Collection Time: 12/10/13  6:40 AM  Result Value Ref Range   Sodium 142 137 - 147 mEq/L   Potassium 3.7 3.7 - 5.3 mEq/L   Chloride 106 96 - 112 mEq/L   CO2 23 19 - 32 mEq/L   Glucose, Bld 106 (H) 70 - 99 mg/dL   BUN 9 6 - 23 mg/dL   Creatinine, Ser 0.75 0.50 - 1.10 mg/dL   Calcium 8.8 8.4 - 10.5 mg/dL   Total Protein 6.1 6.0 - 8.3 g/dL   Albumin 3.2 (L) 3.5 - 5.2 g/dL   AST 23 0 - 37 U/L   ALT 17 0 - 35 U/L   Alkaline Phosphatase 79 39 - 117 U/L   Total Bilirubin 0.6 0.3 - 1.2 mg/dL   GFR calc non Af Amer >90 >90 mL/min   GFR calc Af Amer >90 >90 mL/min    Comment: (NOTE) The eGFR has been calculated using the CKD EPI equation. This calculation has not been validated in all clinical situations. eGFR's persistently <90 mL/min signify possible Chronic Kidney Disease.    Anion gap 13 5 - 15  CBC     Status: Abnormal   Collection Time: 12/10/13  6:40 AM  Result Value Ref Range   WBC 8.6 4.0 - 10.5 K/uL   RBC 3.73 (L) 3.87 - 5.11 MIL/uL   Hemoglobin 11.1 (L) 12.0 - 15.0 g/dL   HCT 33.2 (L) 36.0 - 46.0 %   MCV 89.0 78.0 - 100.0 fL   MCH 29.8 26.0 - 34.0 pg   MCHC 33.4 30.0 - 36.0 g/dL   RDW 13.2 11.5 - 15.5 %   Platelets 159 150 - 400 K/uL  Protime-INR     Status: Abnormal   Collection Time: 12/10/13  6:40 AM  Result Value Ref Range   Prothrombin Time 17.0 (H) 11.6 - 15.2 seconds   INR 1.37 0.00 - 1.49  Sedimentation rate      Status: None   Collection Time: 12/10/13  6:40 AM  Result Value Ref Range   Sed Rate 12 0 - 22 mm/hr  Lactic acid, plasma     Status: None   Collection Time: 12/10/13  8:10 AM  Result Value Ref Range   Lactic Acid, Venous 1.0 0.5 - 2.2 mmol/L    MICRO: 11/17 blood cx IMAGING: Dg Chest 2 View  12/10/2013   CLINICAL DATA:  Fever, nausea and vomiting, no chest complaints. To check PICC line.  EXAM: CHEST  2 VIEW  COMPARISON:  Chest fluoroscopy 12/03/2013  FINDINGS: Right PICC line with tip projected over the upper SVC region. No pneumothorax. Mild hyperinflation. Normal heart size and pulmonary vascularity. No focal airspace disease or consolidation in the lungs. No blunting of costophrenic angles. Mediastinal contours appear intact.  IMPRESSION: PICC line tip projects over the upper SVC. No pneumothorax. Lungs clear. Mild hyperinflation.   Electronically Signed   By: Lucienne Capers M.D.   On: 12/10/2013 00:13   Dg Foot Complete Right  12/10/2013   CLINICAL DATA:  Wound in the first toe.  Pain.  Fever.  Infection.  EXAM: RIGHT FOOT COMPLETE - 3+ VIEW  COMPARISON:  11/14/2013  FINDINGS: Mild hallux valgus deformity. No evidence of acute fracture or dislocation of the right foot. No focal bone lesion or bone destruction. Bone cortex appears intact. No cortical erosion or sclerosis to suggest osteomyelitis. Soft tissues are unremarkable.  IMPRESSION: Mild hallux valgus deformity. No acute bony abnormalities. No changes to suggest osteomyelitis.  Electronically Signed   By: Lucienne Capers M.D.   On: 12/10/2013 02:38    Assessment/Plan:  58yo F receiving vanco and ceftriaxone for osteomyelitis of right foot who reports having prodrome of fever, myalgia, and malaise.  - can continue on broad spectrum abtx for next 48hr awaiting culture results,but presentation might also be concominant viral infection - recommned to for check for influenza - for osteomyelitis, does not appear to be worse,  continue on broad spectrum. If nothing new is found, recommend to switch back to vanco and ctx  [facility] [phone]

## 2013-12-10 NOTE — H&P (Signed)
[facility] History and Physical  Patient: Carrie [name]  YPP:[id number]  DOB: [birth year]  DOS: the patient was seen and examined on 12/10/2013 PCP: Myrlene Broker, MD  Chief Complaint: fever and chills  HPI: [name] is a 58 y.o. female with Past medical history of Osteomyelitis of the left toe. The patient is presenting with complaints of fever that has been ongoing since last 2 days. This is associated with generalized malaise and myalgia. Patient has history of chronic osteomyelitis and was on oral antibiotics. Recently since her cellulitis was not improving she was switched to IV vancomycin and IV ceftriaxone. She underwent PICC line placement on 12/03/2013 and the patient has been working good.  She denies taking any other medications and denies taking any over-the-counter supplements. She complains of some cough but denies any sputum production Or shortness of breath. Denies any chest pain nausea vomiting abdominal pain diarrhea constipation burning urination or rash anywhere. She denies any drug abuse. She complains of some headache but denies any photophobia or phonophobia or neck stiffness.  The patient is coming from home And at her baseline independent for most of her ADL.  Review of Systems: as mentioned in the history of present illness.  A Comprehensive review of the other systems is negative.  Past Medical History  Diagnosis Date  . Osteomyelitis of toe    Past Surgical History  Procedure Laterality Date  . Biopsy of toe    . Debridement of toe     Social History:  reports that she has never smoked. She has never used smokeless tobacco. She reports that she does not drink alcohol or use illicit drugs.  Allergies  Allergen Reactions  . Phenazopyridine Other (See Comments)    Racing heart, pass out  . Pyridium [Phenazopyridine Hcl] Other (See Comments)    Racing heart, passing out    History reviewed. No pertinent family history.  Prior to Admission  medications   Medication Sig Start Date End Date Taking? Authorizing Provider  polyethylene glycol (MIRALAX / GLYCOLAX) packet Take 17 g by mouth daily.   Yes Historical Provider, MD  PRESCRIPTION MEDICATION Inject 1 Applicatorful as directed 2 (two) times daily. Ceftriaxone sodium 2Gm in 19m  Heparin 10units/ml 523msyringe Normal saline flush 0.9% 1024myringe Vancomycin 1250m53m 262ml46m  Yes Historical Provider, MD  ciprofloxacin (CIPRO) 500 MG tablet Take 1 tablet (500 mg total) by mouth 2 (two) times daily. Patient not taking: Reported on 12/09/2013 10/17/13   MatthCelesta Gentile  clindamycin (CLEOCIN) 300 MG capsule Take 1 capsule (300 mg total) by mouth 3 (three) times daily. Patient not taking: Reported on 12/09/2013 10/17/13   MatthCelesta Gentile    Physical Exam: Filed Vitals:   12/10/13 0300 12/10/13 0330 12/10/13 0359 12/10/13 0616  BP: 94/49 92/53 100/51 78/55  Pulse: 107 105 107 105  Temp: 100.6 F (38.1 C)  100 F (37.8 C) 98.8 F (37.1 C)  TempSrc: Oral  Oral Oral  Resp: 14 23 18 18   Height:   5' 3"  (1.6 m)   Weight:   62.37 kg (137 lb 8 oz) 62.37 kg (137 lb 8 oz)  SpO2: 99% 99% 95% 100%    General: Alert, Awake and Oriented to Time, Place and Person. Appear in mild distress Eyes: PERRL ENT: Oral Mucosa clear moist. Neck: no JVD Cardiovascular: S1 and S2 Present, no Murmur, Peripheral Pulses Present Respiratory: Bilateral Air entry equal and Decreased, Clear to Auscultation, noCrackles, no wheezes Abdomen: Bowel Sound  present, Soft and non tender Skin: no Rash Extremities:  Right toe well-healing infection, no Pedal edema, no calf tenderness Neurologic: Grossly no focal neuro deficit.  Labs on Admission:  CBC:  Recent Labs Lab 12/09/13 2123  WBC 10.4  NEUTROABS 9.0*  HGB 11.4*  HCT 34.0*  MCV 86.3  PLT 179    CMP     Component Value Date/Time   NA 143 11/12/2013 1004   K 4.1 11/12/2013 1004   CL 105 11/12/2013 1004   CO2 26 11/12/2013  1004   GLUCOSE 80 11/12/2013 1004   BUN 9 11/12/2013 1004   CREATININE 0.78 11/12/2013 1004   CALCIUM 9.9 11/12/2013 1004   GFRNONAA 85 11/12/2013 1004   GFRAA >89 11/12/2013 1004    No results for input(s): LIPASE, AMYLASE in the last 168 hours. No results for input(s): AMMONIA in the last 168 hours.  No results for input(s): CKTOTAL, CKMB, CKMBINDEX, TROPONINI in the last 168 hours. BNP (last 3 results) No results for input(s): PROBNP in the last 8760 hours.  Radiological Exams on Admission: Dg Chest 2 View  12/10/2013   CLINICAL DATA:  Fever, nausea and vomiting, no chest complaints. To check PICC line.  EXAM: CHEST  2 VIEW  COMPARISON:  Chest fluoroscopy 12/03/2013  FINDINGS: Right PICC line with tip projected over the upper SVC region. No pneumothorax. Mild hyperinflation. Normal heart size and pulmonary vascularity. No focal airspace disease or consolidation in the lungs. No blunting of costophrenic angles. Mediastinal contours appear intact.  IMPRESSION: PICC line tip projects over the upper SVC. No pneumothorax. Lungs clear. Mild hyperinflation.   Electronically Signed   By: Lucienne Capers M.D.   On: 12/10/2013 00:13   Dg Foot Complete Right  12/10/2013   CLINICAL DATA:  Wound in the first toe.  Pain.  Fever.  Infection.  EXAM: RIGHT FOOT COMPLETE - 3+ VIEW  COMPARISON:  11/14/2013  FINDINGS: Mild hallux valgus deformity. No evidence of acute fracture or dislocation of the right foot. No focal bone lesion or bone destruction. Bone cortex appears intact. No cortical erosion or sclerosis to suggest osteomyelitis. Soft tissues are unremarkable.  IMPRESSION: Mild hallux valgus deformity. No acute bony abnormalities. No changes to suggest osteomyelitis.   Electronically Signed   By: Lucienne Capers M.D.   On: 12/10/2013 02:38    Assessment/Plan Principal Problem:   Sepsis Active Problems:   Osteomyelitis   Fever   Obstructive sleep apnea   1. Sepsis The patient is presenting  with complaints of fever and chills. She recently underwent placement of a PICC line. She has soft blood pressure with normal white count. Chest x-ray is clear x-ray of the foot does not show any evidence of significant osteomyelitis. Patient does not appear to have any evidence of pneumonia urine is clear. With this etiology operations current presentation is unclear. She has been having on and off soft hypotension and therefore I would give her IV fluids. Broaden antibiotic coverage from vancomycin ceftriaxone and vancomycin Zosyn and ciprofloxacin. Check CMP, INR, ESR, CRP, lactic acid levels. Follow blood cultures. Check influenza PCR, urine antigens sputum antigens. Infectious diseases already consulted who will be following up with the patient. Patient was initially accepted for observationbut due to recurrence of hypotension she will be changed to admission  2. Obstructive sleep apnea  Patient uses C Pap at night. We will continue Cipro daily at bedtime.  Advance goals of care discussion: full code   Consults: infectious disease  DVT Prophylaxis:  subcutaneous Heparin Nutrition: regular diet  Disposition: Admitted to inpatient in telemetry unit.  Author: Berle Mull, MD [facility] Pager: [phone] 12/10/2013, 6:25 AM    If 7PM-7AM, please contact night-coverage [email or web address] Password [email or web address]

## 2013-12-10 NOTE — Progress Notes (Signed)
Patient transferred to [phone], alert and oriented. Patient oriented to room and equipment. Headache of 7/10 reported. Pain med given prior to arrival. Will continue to monitor.

## 2013-12-10 NOTE — Progress Notes (Addendum)
ANTIBIOTIC CONSULT NOTE - INITIAL  Pharmacy Consult for Vancomycin/Zosyn Indication: osteomyelitis / sepsis  Allergies  Allergen Reactions  . Phenazopyridine Other (See Comments)    Racing heart, pass out  . Pyridium [Phenazopyridine Hcl] Other (See Comments)    Racing heart, passing out    Patient Measurements: Height: 5\' 3"  (160 cm) Weight: 137 lb 8 oz (62.37 kg) IBW/kg (Calculated) : 52.4   Labs:  Recent Labs  12/09/13 2123  WBC 10.4  HGB 11.4*  PLT 179   Estimated Creatinine Clearance: 64.2 mL/min (by C-G formula based on Cr of 0.78). No results for input(s): VANCOTROUGH, VANCOPEAK, VANCORANDOM, GENTTROUGH, GENTPEAK, GENTRANDOM, TOBRATROUGH, TOBRAPEAK, TOBRARND, AMIKACINPEAK, AMIKACINTROU, AMIKACIN in the last 72 hours.   Microbiology: No results found for this or any previous visit (from the past 720 hour(s)).  Medical History: Past Medical History  Diagnosis Date  . Osteomyelitis of toe     Medications:  Prescriptions prior to admission  Medication Sig Dispense Refill Last Dose  . polyethylene glycol (MIRALAX / GLYCOLAX) packet Take 17 g by mouth daily.   12/09/2013 at Unknown time  . PRESCRIPTION MEDICATION Inject 1 Applicatorful as directed 2 (two) times daily. Ceftriaxone sodium 2Gm in 20ml  Heparin 10units/ml 5ml syringe Normal saline flush 0.9% 10ml syringe Vancomycin 1250mg  in 262ml NS   12/09/2013 at Unknown time  . ciprofloxacin (CIPRO) 500 MG tablet Take 1 tablet (500 mg total) by mouth 2 (two) times daily. (Patient not taking: Reported on 12/09/2013) 20 tablet 0 Not Taking  . clindamycin (CLEOCIN) 300 MG capsule Take 1 capsule (300 mg total) by mouth 3 (three) times daily. (Patient not taking: Reported on 12/09/2013) 30 capsule 2 Not Taking   Assessment: 58 yo female with fevers, h/o osteomyelitis and PICC line in place, for empiric antibiotics.  Receiving Rocephin 2 g IV once daily and Vancomycin (dose unknown) twice daily at home for  osteomyelitis.  Last doses were ~ 7 pm 11/17.  [facility]  Pt reports vancomycin level drawn earlier this week  Random vancomycin level = 15.3 (dose given at 3 am -- ok for Q 12 hour regimen and not at a toxic level currently from outpatient treatment)  Goal of Therapy:  Vancomycin trough level 15-20 mcg/ml  Plan:  Vancomycin 750 mg iv Q 12 hours Zosyn 3.375 grams iv Q 8 hours Follow up Scr, cultures course.  Thank you. Okey RegalLisa Vianne Grieshop, PharmD [phone]

## 2013-12-10 NOTE — ED Provider Notes (Signed)
CSN: [id number]     Arrival date & time 12/09/13  2042 History   First MD Initiated Contact with Patient 12/09/13 2336     Chief Complaint  Patient presents with  . Fever    The patient came in due to having a fever of 102.8.  The patient did not take anything for the fever.        (Consider location/radiation/quality/duration/timing/severity/associated sxs/prior Treatment) Patient is a 58 y.o. female presenting with fever. The history is provided by the patient.  Fever Temp source:  Oral Severity:  Moderate Onset quality:  Gradual Timing:  Constant Progression:  Partially resolved Chronicity:  New Relieved by:  Nothing Worsened by:  Nothing tried Ineffective treatments:  None tried Associated symptoms: nausea   Associated symptoms: no chest pain, no chills, no confusion, no congestion, no cough, no diarrhea, no dysuria, no ear pain, no headaches, no rash, no rhinorrhea, no somnolence, no sore throat and no vomiting   Nausea:    Severity:  Moderate   Onset quality:  Gradual   Timing:  Constant   Progression:  Unchanged Risk factors: no hx of cancer and no recent surgery     Past Medical History  Diagnosis Date  . Osteomyelitis of toe    Past Surgical History  Procedure Laterality Date  . Biopsy of toe    . Debridement of toe     History reviewed. No pertinent family history. History  Substance Use Topics  . Smoking status: Never Smoker   . Smokeless tobacco: Never Used  . Alcohol Use: No   OB History    No data available     Review of Systems  Constitutional: Positive for fever. Negative for chills.  HENT: Negative for congestion, drooling, ear pain, rhinorrhea and sore throat.   Respiratory: Negative for cough.   Cardiovascular: Negative for chest pain.  Gastrointestinal: Positive for nausea. Negative for vomiting and diarrhea.  Genitourinary: Negative for dysuria.  Skin: Negative for rash.  Neurological: Negative for headaches.  Psychiatric/Behavioral:  Negative for confusion.  All other systems reviewed and are negative.     Allergies  Phenazopyridine and Pyridium  Home Medications   Prior to Admission medications   Medication Sig Start Date End Date Taking? Authorizing Provider  polyethylene glycol (MIRALAX / GLYCOLAX) packet Take 17 g by mouth daily.   Yes Historical Provider, MD  PRESCRIPTION MEDICATION Inject 1 Applicatorful as directed 2 (two) times daily. Ceftriaxone sodium 2Gm in 20ml  Heparin 10units/ml 5ml syringe Normal saline flush 0.9% 10ml syringe Vancomycin 1250mg  in 262ml NS   Yes Historical Provider, MD  ciprofloxacin (CIPRO) 500 MG tablet Take 1 tablet (500 mg total) by mouth 2 (two) times daily. Patient not taking: Reported on 12/09/2013 10/17/13   Ovid CurdMatthew Wagoner, DPM  clindamycin (CLEOCIN) 300 MG capsule Take 1 capsule (300 mg total) by mouth 3 (three) times daily. Patient not taking: Reported on 12/09/2013 10/17/13   Ovid CurdMatthew Wagoner, DPM   BP 104/60 mmHg  Pulse 94  Temp(Src) 99 F (37.2 C) (Oral)  Resp 21  Ht 5\' 3"  (1.6 m)  Wt 130 lb (58.968 kg)  BMI 23.03 kg/m2  SpO2 98% Physical Exam  Constitutional: She is oriented to person, place, and time. She appears well-developed and well-nourished. No distress.  HENT:  Head: Normocephalic and atraumatic.  Mouth/Throat: Oropharynx is clear and moist.  Eyes: Conjunctivae are normal. Pupils are equal, round, and reactive to light.  Neck: Normal range of motion. Neck supple.  Cardiovascular: Normal rate,  regular rhythm and intact distal pulses.   Pulmonary/Chest: Effort normal and breath sounds normal. She has no wheezes. She has no rales.  Abdominal: Soft. Bowel sounds are normal. There is no tenderness. There is no rebound and no guarding.  Musculoskeletal: Normal range of motion.  picc line site CDI  Lymphadenopathy:    She has no cervical adenopathy.  Neurological: She is alert and oriented to person, place, and time.  Skin: Skin is warm and dry. No rash  noted.  Psychiatric: She has a normal mood and affect.    ED Course  Procedures (including critical care time) Labs Review Labs Reviewed  CBC WITH DIFFERENTIAL - Abnormal; Notable for the following:    Hemoglobin 11.4 (*)    HCT 34.0 (*)    Neutrophils Relative % 87 (*)    Neutro Abs 9.0 (*)    Lymphocytes Relative 6 (*)    Lymphs Abs 0.6 (*)    All other components within normal limits  CULTURE, BLOOD (ROUTINE X 2)  CULTURE, BLOOD (ROUTINE X 2)  URINALYSIS, ROUTINE W REFLEX MICROSCOPIC  I-STAT CG4 LACTIC ACID, ED  I-STAT CHEM 8, ED    Imaging Review No results found.   EKG Interpretation None      MDM   Final diagnoses:  Fever    Case d/w Dr. Ilsa IhaSnyder observation and ID consult  Case d/w Dr. Allena KatzPatel Obs tele continue plan    Lorene Samaan K Alrick Cubbage-Rasch, MD 12/10/13 [id number]

## 2013-12-11 ENCOUNTER — Encounter: Payer: Self-pay | Admitting: Internal Medicine

## 2013-12-11 DIAGNOSIS — R197 Diarrhea, unspecified: Secondary | ICD-10-CM

## 2013-12-11 LAB — LEGIONELLA ANTIGEN, URINE

## 2013-12-11 MED ORDER — PROMETHAZINE HCL 25 MG/ML IJ SOLN
25.0000 mg | Freq: Once | INTRAMUSCULAR | Status: AC
  Administered 2013-12-11: 25 mg via INTRAVENOUS
  Filled 2013-12-11: qty 1

## 2013-12-11 MED ORDER — DIPHENHYDRAMINE HCL 50 MG/ML IJ SOLN
25.0000 mg | Freq: Once | INTRAMUSCULAR | Status: AC
  Administered 2013-12-11: 25 mg via INTRAVENOUS
  Filled 2013-12-11: qty 1

## 2013-12-11 NOTE — Plan of Care (Signed)
Problem: Phase I Progression Outcomes Goal: Pain controlled with appropriate interventions Outcome: Completed/Met Date Met:  12/11/13 Goal: OOB as tolerated unless otherwise ordered Outcome: Completed/Met Date Met:  12/11/13 Patient ambulating independently. Goal: Initial discharge plan identified Outcome: Progressing Goal: Voiding-avoid urinary catheter unless indicated Outcome: Completed/Met Date Met:  12/11/13 Goal: Hemodynamically stable Outcome: Progressing Goal: Other Phase I Outcomes/Goals Outcome: Not Applicable Date Met:  28/97/91

## 2013-12-11 NOTE — Progress Notes (Signed)
[facility]    Date of Admission:  12/09/2013   Total days of antibiotics 2        Day 2 vanco        Day 2 piptazo           ID: Carrie [name] is a 58 y.o. female with foot osteomyelitis and now febrile illness with diarrhea  Principal Problem:   Sepsis Active Problems:   Osteomyelitis   Fever   Obstructive sleep apnea    Subjective: Still febrile, with diarrhea , 5 watery stools in  12 h  Medications:  . heparin  5,000 Units Subcutaneous 3 times per day  . piperacillin-tazobactam (ZOSYN)  IV  3.375 g Intravenous 3 times per day  . sodium chloride  3 mL Intravenous Q12H  . vancomycin  750 mg Intravenous Q12H    Objective: Vital signs in last 24 hours: Temp:  [99 F (37.2 C)-101.2 F (38.4 C)] 99.2 F (37.3 C) (11/19 1357) Pulse Rate:  [78-96] 86 (11/19 1357) Resp:  [18-20] 18 (11/19 1357) BP: (94-103)/(51-63) 103/58 mmHg (11/19 1357) SpO2:  [99 %-100 %] 99 % (11/19 1357) Weight:  [137 lb 8 oz (62.37 kg)] 137 lb 8 oz (62.37 kg) (11/19 0533) Physical Exam  Constitutional:  oriented to person, place, and time. appears well-developed and well-nourished. No distress.  HENT:  Mouth/Throat: Oropharynx is clear and moist. No oropharyngeal exudate.  Cardiovascular: Normal rate, regular rhythm and normal heart sounds. Exam reveals no gallop and no friction rub.  No murmur heard.  Pulmonary/Chest: Effort normal and breath sounds normal. No respiratory distress.  has no wheezes.  Abdominal: Soft. Bowel sounds are normal.  exhibits no distension. There is no tenderness.  Lymphadenopathy: no cervical adenopathy.  Neurological: alert and oriented to person, place, and time.  Skin: Skin is warm and dry. No rash noted. No erythema.  Psychiatric: a normal mood and affect. His behavior is normal.    Lab Results  Recent Labs  12/09/13 2123 12/10/13 0001 12/10/13 0640  WBC 10.4  --  8.6  HGB 11.4* 13.3 11.1*  HCT 34.0* 39.0 33.2*  NA  --  137  142  K  --  3.7 3.7  CL  --  102 106  CO2  --   --  23  BUN  --  10 9  CREATININE  --  0.70 0.75   Liver Panel  Recent Labs  12/10/13 0640  PROT 6.1  ALBUMIN 3.2*  AST 23  ALT 17  ALKPHOS 79  BILITOT 0.6   Sedimentation Rate  Recent Labs  12/10/13 0640  ESRSEDRATE 12   C-Reactive Protein  Recent Labs  12/10/13 0640  CRP 3.2*    Microbiology:  Studies/Results: Dg Chest 2 View  12/10/2013   CLINICAL DATA:  Fever, nausea and vomiting, no chest complaints. To check PICC line.  EXAM: CHEST  2 VIEW  COMPARISON:  Chest fluoroscopy 12/03/2013  FINDINGS: Right PICC line with tip projected over the upper SVC region. No pneumothorax. Mild hyperinflation. Normal heart size and pulmonary vascularity. No focal airspace disease or consolidation in the lungs. No blunting of costophrenic angles. Mediastinal contours appear intact.  IMPRESSION: PICC line tip projects over the upper SVC. No pneumothorax. Lungs clear. Mild hyperinflation.   Electronically Signed   By: Burman NievesWilliam  [name] M.D.   On: 12/10/2013 00:13   Dg Foot Complete Right  12/10/2013   CLINICAL DATA:  Wound in the first toe.  Pain.  Fever.  Infection.  EXAM: RIGHT FOOT COMPLETE - 3+ VIEW  COMPARISON:  11/14/2013  FINDINGS: Mild hallux valgus deformity. No evidence of acute fracture or dislocation of the right foot. No focal bone lesion or bone destruction. Bone cortex appears intact. No cortical erosion or sclerosis to suggest osteomyelitis. Soft tissues are unremarkable.  IMPRESSION: Mild hallux valgus deformity. No acute bony abnormalities. No changes to suggest osteomyelitis.   Electronically Signed   By: Burman NievesWilliam  [name] M.D.   On: 12/10/2013 02:38     Assessment/Plan: Diarrhea = concern for viral gastroenteritis vs. Cdiff. Will check gi pathogen panel and cdiff pcr now. Place on enteric precautions  Foot osteo = on vanco and piptazo presently  Fever = will continue with prn tylenol  ? Patient needs letter sent to  [place] for missing upcoming court date tomorrow  Drue SecondSNIDER, [facility] Cell: [phone] Pager: [phone]  12/11/2013, 2:43 PM

## 2013-12-11 NOTE — Progress Notes (Signed)
[facility] PROGRESS NOTE  Carrie [name] HYQ:[id number] DOB: [birth year] DOA: 12/09/2013 PCP: Carrie PenOBBINS,ROBERT A, MD  Assessment/Plan: 1-SIRS/SEPSIS: unclear etiology. Patient with diarrhea and hx of abx's use for osteomyelitis. -no elevated WBC's -will check C. Diff and GI pathogen -influenza neg, CXR unrevealing and UA neg for UTI -per ID continue current antibiotics -follow blood cx's  2-FUO: continue empiric broad spectrum antibiotics. Follow cx's and ID rec's  3-OSA: continue CPAP at bedtime  4-HA's: will treat with phenergan and benadryl  Code Status: Full Family Communication: no family at bedside Disposition Plan: to be determine    Consultants:  ID  Procedures:  See below for x-ray reports   Antibiotics:  Vancomycin   Zosyn   HPI/Subjective: Still spiking fever; continue complaining of HA's. Had episode of diarrhea. No nausea or vomiting. Tolerating CLD  Objective: Filed Vitals:   12/11/13 1357  BP: 103/58  Pulse: 86  Temp: 99.2 F (37.3 C)  Resp: 18    Intake/Output Summary (Last 24 hours) at 12/11/13 1754 Last data filed at 12/11/13 1015  Gross per 24 hour  Intake 2275.83 ml  Output      0 ml  Net 2275.83 ml   Filed Weights   12/10/13 0359 12/10/13 0616 12/11/13 0533  Weight: 62.37 kg (137 lb 8 oz) 62.37 kg (137 lb 8 oz) 62.37 kg (137 lb 8 oz)    Exam:   General:  AAOX3, mild distress due to HA's; patient continue spiking fever  Cardiovascular: S1 and S2, no rubs or gallops  Respiratory: CTA bilaterally  Abdomen: soft, NT, ND, positive BS  Musculoskeletal: no edema or cyanosis; right big toe with mild erythema and swelling; no drainage or angry appereance.  Data Reviewed: Basic Metabolic Panel:  Recent Labs Lab 12/10/13 0001 12/10/13 0640  NA 137 142  K 3.7 3.7  CL 102 106  CO2  --  23  GLUCOSE 120* 106*  BUN 10 9  CREATININE 0.70 0.75  CALCIUM  --  8.8   Liver Function Tests:  Recent Labs Lab 12/10/13 0640   AST 23  ALT 17  ALKPHOS 79  BILITOT 0.6  PROT 6.1  ALBUMIN 3.2*   CBC:  Recent Labs Lab 12/09/13 2123 12/10/13 0001 12/10/13 0640  WBC 10.4  --  8.6  NEUTROABS 9.0*  --   --   HGB 11.4* 13.3 11.1*  HCT 34.0* 39.0 33.2*  MCV 86.3  --  89.0  PLT 179  --  159    Recent Results (from the past 240 hour(s))  Blood culture (routine x 2)     Status: None (Preliminary result)   Collection Time: 12/09/13 11:50 PM  Result Value Ref Range Status   Specimen Description BLOOD LEFT ARM  Final   Special Requests BOTTLES DRAWN AEROBIC AND ANAEROBIC 10CC  Final   Culture  Setup Time   Final    12/10/2013 03:11 Performed at [facility]    Culture   Final           BLOOD CULTURE RECEIVED NO GROWTH TO DATE CULTURE WILL BE HELD FOR 5 DAYS BEFORE ISSUING [name] FINAL NEGATIVE REPORT Performed at [facility]    Report Status PENDING  Incomplete  Blood culture (routine x 2)     Status: None (Preliminary result)   Collection Time: 12/09/13 11:55 PM  Result Value Ref Range Status   Specimen Description BLOOD LEFT HAND  Final   Special Requests BOTTLES DRAWN AEROBIC ONLY 3CC  Final  Culture  Setup Time   Final    12/10/2013 03:11 Performed at [facility]    Culture   Final           BLOOD CULTURE RECEIVED NO GROWTH TO DATE CULTURE WILL BE HELD FOR 5 DAYS BEFORE ISSUING [name] FINAL NEGATIVE REPORT Performed at [facility]    Report Status PENDING  Incomplete     Studies: Dg Chest 2 View  12/10/2013   CLINICAL DATA:  Fever, nausea and vomiting, no chest complaints. To check PICC line.  EXAM: CHEST  2 VIEW  COMPARISON:  Chest fluoroscopy 12/03/2013  FINDINGS: Right PICC line with tip projected over the upper SVC region. No pneumothorax. Mild hyperinflation. Normal heart size and pulmonary vascularity. No focal airspace disease or consolidation in the lungs. No blunting of costophrenic angles. Mediastinal contours appear intact.  IMPRESSION: PICC line tip  projects over the upper SVC. No pneumothorax. Lungs clear. Mild hyperinflation.   Electronically Signed   By: Burman NievesWilliam  [name] M.D.   On: 12/10/2013 00:13   Dg Foot Complete Right  12/10/2013   CLINICAL DATA:  Wound in the first toe.  Pain.  Fever.  Infection.  EXAM: RIGHT FOOT COMPLETE - 3+ VIEW  COMPARISON:  11/14/2013  FINDINGS: Mild hallux valgus deformity. No evidence of acute fracture or dislocation of the right foot. No focal bone lesion or bone destruction. Bone cortex appears intact. No cortical erosion or sclerosis to suggest osteomyelitis. Soft tissues are unremarkable.  IMPRESSION: Mild hallux valgus deformity. No acute bony abnormalities. No changes to suggest osteomyelitis.   Electronically Signed   By: Burman NievesWilliam  [name] M.D.   On: 12/10/2013 02:38    Scheduled Meds: . diphenhydrAMINE  25 mg Intravenous Once  . heparin  5,000 Units Subcutaneous 3 times per day  . piperacillin-tazobactam (ZOSYN)  IV  3.375 g Intravenous 3 times per day  . promethazine  25 mg Intravenous Once  . sodium chloride  3 mL Intravenous Q12H  . vancomycin  750 mg Intravenous Q12H   Continuous Infusions: . sodium chloride 125 mL/hr at 12/11/13 0334    Principal Problem:   Sepsis Active Problems:   Osteomyelitis   Fever   Obstructive sleep apnea    Time spent: 30 minutes    Vassie LollMadera, Jamira Barfuss  [facility] Pager [phone]. If 7PM-7AM, please contact night-coverage at [email or web address], password [email or web address] 12/11/2013, 5:54 PM  LOS: 2 days

## 2013-12-11 NOTE — Progress Notes (Signed)
Found pt already on home CPAP. Pt resting well.

## 2013-12-12 ENCOUNTER — Encounter: Payer: BC Managed Care – PPO | Admitting: Podiatry

## 2013-12-12 DIAGNOSIS — A047 Enterocolitis due to Clostridium difficile: Secondary | ICD-10-CM

## 2013-12-12 DIAGNOSIS — A0472 Enterocolitis due to Clostridium difficile, not specified as recurrent: Secondary | ICD-10-CM | POA: Insufficient documentation

## 2013-12-12 LAB — GI PATHOGEN PANEL BY PCR, STOOL
C difficile toxin A/B: POSITIVE
Campylobacter by PCR: NEGATIVE
Cryptosporidium by PCR: NEGATIVE
E COLI (STEC): NEGATIVE
E COLI 0157 BY PCR: NEGATIVE
E coli (ETEC) LT/ST: NEGATIVE
G lamblia by PCR: NEGATIVE
Norovirus GI/GII: NEGATIVE
Rotavirus A by PCR: NEGATIVE
SALMONELLA BY PCR: NEGATIVE
Shigella by PCR: NEGATIVE

## 2013-12-12 LAB — CLOSTRIDIUM DIFFICILE BY PCR: Toxigenic C. Difficile by PCR: POSITIVE — AB

## 2013-12-12 MED ORDER — VANCOMYCIN HCL IN DEXTROSE 750-5 MG/150ML-% IV SOLN
750.0000 mg | Freq: Two times a day (BID) | INTRAVENOUS | Status: DC
  Filled 2013-12-12: qty 150

## 2013-12-12 MED ORDER — METRONIDAZOLE 500 MG PO TABS
500.0000 mg | ORAL_TABLET | Freq: Three times a day (TID) | ORAL | Status: DC
  Administered 2013-12-12 – 2013-12-13 (×3): 500 mg via ORAL
  Filled 2013-12-12 (×5): qty 1

## 2013-12-12 MED ORDER — SACCHAROMYCES BOULARDII 250 MG PO CAPS
250.0000 mg | ORAL_CAPSULE | Freq: Two times a day (BID) | ORAL | Status: DC
  Administered 2013-12-12 – 2013-12-13 (×2): 250 mg via ORAL
  Filled 2013-12-12 (×3): qty 1

## 2013-12-12 MED ORDER — METRONIDAZOLE 500 MG PO TABS
500.0000 mg | ORAL_TABLET | Freq: Four times a day (QID) | ORAL | Status: DC
  Administered 2013-12-12: 500 mg via ORAL
  Filled 2013-12-12 (×4): qty 1

## 2013-12-12 MED ORDER — CETYLPYRIDINIUM CHLORIDE 0.05 % MT LIQD: 7.0000 mL | Freq: Two times a day (BID) | OROMUCOSAL | Status: DC

## 2013-12-12 MED ORDER — CEFTRIAXONE SODIUM IN DEXTROSE 40 MG/ML IV SOLN
2.0000 g | INTRAVENOUS | Status: DC
  Administered 2013-12-12 – 2013-12-13 (×2): 2 g via INTRAVENOUS
  Filled 2013-12-12 (×2): qty 50

## 2013-12-12 MED ORDER — VANCOMYCIN HCL IN DEXTROSE 750-5 MG/150ML-% IV SOLN
750.0000 mg | Freq: Two times a day (BID) | INTRAVENOUS | Status: DC
  Administered 2013-12-12 – 2013-12-13 (×2): 750 mg via INTRAVENOUS
  Filled 2013-12-12 (×4): qty 150

## 2013-12-12 NOTE — Care Management Note (Signed)
    Page 1 of 2   12/13/2013     3:21:18 PM CARE MANAGEMENT NOTE 12/13/2013  Patient:  Carrie, [name]   Account Number:  [id number]  Date Initiated:  12/12/2013  Documentation initiated by:  McNally,Kristin  Subjective/Objective Assessment:   pt admit with sepsis     Action/Plan:   pt from home, [facility] with [facility] for home iv abx for osteo   Anticipated DC Date:  12/15/2013   Anticipated DC Plan:  [place]  CM consult      [facility] Choice  HOME HEALTH   Choice offered to / List presented to:          [facility] arranged  HH-1 RN      [facility] agency  [place]   Status of service:  Completed, signed off Medicare Important Message given?   (If response is "NO", the following Medicare IM given date fields will be blank) Date Medicare IM given:   Medicare IM given by:   Date Additional Medicare IM given:   Additional Medicare IM given by:    Discharge Disposition:  [place]  Per UR Regulation:    If discussed at Long Length of Stay Meetings, dates discussed:    Comments:  12/13/13 13:00 CM met with pt who states she does NOT have home health with [facility] rather she has [facility] with [facility] and specifically [place]. CM called [facility] rep, Miranda who (after multiple calls) spoke with prescribing MD and will arrange for medications to be delivered.  Cm called [facility] and spoke with Debbie who requested I fax the orders, facesheet, F2F, H&P, and DC summary to [phone] which was completed and faxed.  No other CM needs were communicated.  Carrie [name], BSN, [place] [phone].  12/12/13 [name] rn pt admit with sepsis. pt from home & [facility] with [facility] for [facility]x. Anticipated dc back home with prev IV abx. Will need to f/u with [facility] at time of dc.

## 2013-12-12 NOTE — Plan of Care (Signed)
Problem: Consults Goal: General Medical Patient Education See Patient Education Module for specific education.  Outcome: Completed/Met Date Met:  12/12/13 Goal: Skin Care Protocol Initiated - if Braden Score 18 or less If consults are not indicated, leave blank or document N/A  Outcome: Not Applicable Date Met:  47/18/55 Goal: Nutrition Consult-if indicated Outcome: Not Applicable Date Met:  01/58/68 Goal: Diabetes Guidelines if Diabetic/Glucose > 140 If diabetic or lab glucose is > 140 mg/dl - Initiate Diabetes/Hyperglycemia Guidelines & Document Interventions  Outcome: Not Applicable Date Met:  25/74/93  Problem: Phase I Progression Outcomes Goal: Initial discharge plan identified Outcome: Progressing  Problem: Phase II Progression Outcomes Goal: Progress activity as tolerated unless otherwise ordered Outcome: Completed/Met Date Met:  12/12/13

## 2013-12-12 NOTE — Progress Notes (Signed)
[facility] PROGRESS NOTE  Carrie [name] ZOX:[id number] DOB: 25-Dec-1955 DOA: 12/09/2013 PCP: Hadley PenOBBINS,ROBERT A, MD  Assessment/Plan: 1-SIRS/SEPSIS: unclear etiology. Patient with diarrhea and hx of abx's use for osteomyelitis. -no elevated WBC's -results positive for C. Diff; will treat with flagyl -influenza neg, CXR unrevealing and UA neg for UTI -per ID treat with flagyl and continue vanc/rocephin for osteomyelitis as previously taking  2-FUO: continue empiric broad spectrum antibiotics. Follow cx's and ID rec's  3-OSA: continue CPAP at bedtime  4-HA's: improved/resolved after treatment with phenergan and benadryl  5-osteomyelitis: plan is to continue IV vanc/rocephin  6-enteritis due to C. Diff: will treat along with flagyl. Will d/c PPI's and start florastor.  Code Status: Full Family Communication: no family at bedside Disposition Plan: to be determine    Consultants:  ID  Procedures:  See below for x-ray reports   Antibiotics:  Vancomycin 11/17  Zosyn 11/17>>11/20  Rocephin 11/17  HPI/Subjective: No further fever; no CP, no cough, no SOB. Continue to have diarrhea  Objective: Filed Vitals:   12/12/13 1423  BP: 105/42  Pulse:   Temp: 98.8 F (37.1 C)  Resp: 18    Intake/Output Summary (Last 24 hours) at 12/12/13 1853 Last data filed at 12/12/13 1826  Gross per 24 hour  Intake 4417.5 ml  Output      0 ml  Net 4417.5 ml   Filed Weights   12/10/13 0616 12/11/13 0533 12/12/13 0500  Weight: 62.37 kg (137 lb 8 oz) 62.37 kg (137 lb 8 oz) 62.3 kg (137 lb 5.6 oz)    Exam:   General:  AAOX3, improve HA's; patient afebrile; continue to have diarrhea  Cardiovascular: S1 and S2, no rubs or gallops  Respiratory: CTA bilaterally  Abdomen: soft, NT, ND, positive BS  Musculoskeletal: no edema or cyanosis; right big toe with mild erythema and swelling; no drainage or angry appereance.  Data Reviewed: Basic Metabolic Panel:  Recent Labs Lab  12/10/13 0001 12/10/13 0640  NA 137 142  K 3.7 3.7  CL 102 106  CO2  --  23  GLUCOSE 120* 106*  BUN 10 9  CREATININE 0.70 0.75  CALCIUM  --  8.8   Liver Function Tests:  Recent Labs Lab 12/10/13 0640  AST 23  ALT 17  ALKPHOS 79  BILITOT 0.6  PROT 6.1  ALBUMIN 3.2*   CBC:  Recent Labs Lab 12/09/13 2123 12/10/13 0001 12/10/13 0640  WBC 10.4  --  8.6  NEUTROABS 9.0*  --   --   HGB 11.4* 13.3 11.1*  HCT 34.0* 39.0 33.2*  MCV 86.3  --  89.0  PLT 179  --  159    Recent Results (from the past 240 hour(s))  Blood culture (routine x 2)     Status: None (Preliminary result)   Collection Time: 12/09/13 11:50 PM  Result Value Ref Range Status   Specimen Description BLOOD LEFT ARM  Final   Special Requests BOTTLES DRAWN AEROBIC AND ANAEROBIC 10CC  Final   Culture  Setup Time   Final    12/10/2013 03:11 Performed at [facility]    Culture   Final           BLOOD CULTURE RECEIVED NO GROWTH TO DATE CULTURE WILL BE HELD FOR 5 DAYS BEFORE ISSUING A FINAL NEGATIVE REPORT Performed at [facility]    Report Status PENDING  Incomplete  Blood culture (routine x 2)     Status: None (Preliminary result)   Collection  Time: 12/09/13 11:55 PM  Result Value Ref Range Status   Specimen Description BLOOD LEFT HAND  Final   Special Requests BOTTLES DRAWN AEROBIC ONLY 3CC  Final   Culture  Setup Time   Final    12/10/2013 03:11 Performed at [facility]    Culture   Final           BLOOD CULTURE RECEIVED NO GROWTH TO DATE CULTURE WILL BE HELD FOR 5 DAYS BEFORE ISSUING A FINAL NEGATIVE REPORT Performed at [facility]    Report Status PENDING  Incomplete  Clostridium Difficile by PCR     Status: Abnormal   Collection Time: 12/11/13  5:37 PM  Result Value Ref Range Status   C difficile by pcr POSITIVE (A) NEGATIVE Final    Comment: CRITICAL RESULT CALLED TO, READ BACK BY AND VERIFIED WITH: Larose KellsK. FRALEY RN 10:35 12/12/13 ([email or web address])       Studies: No results found.  Scheduled Meds: . antiseptic oral rinse  7 mL Mouth Rinse BID  . cefTRIAXone (ROCEPHIN)  IV  2 g Intravenous Q24H  . heparin  5,000 Units Subcutaneous 3 times per day  . metroNIDAZOLE  500 mg Oral 3 times per day  . sodium chloride  3 mL Intravenous Q12H  . vancomycin  750 mg Intravenous Q12H   Continuous Infusions: . sodium chloride 50 mL/hr at 12/12/13 1700    Principal Problem:   Sepsis Active Problems:   Osteomyelitis   Fever   Obstructive sleep apnea   Enteritis due to Clostridium difficile    Time spent: 30 minutes    Vassie LollMadera, Safia Panzer  [facility] Pager [phone]. If 7PM-7AM, please contact night-coverage at [email or web address], password [email or web address] 12/12/2013, 6:53 PM  LOS: 3 days

## 2013-12-12 NOTE — Progress Notes (Addendum)
[facility]    Date of Admission:  12/09/2013   Total days of antibiotics 3        Day 3 vanco        Day 3 piptazo           ID: Carrie [name] is a 58 y.o. female with foot osteomyelitis and now febrile illness with diarrhea  Principal Problem:   Sepsis Active Problems:   Osteomyelitis   Fever   Obstructive sleep apnea    Subjective: Afebrile. She reports having 15 bm overnight but as of this morning only 2 bm over 6 hrs. She is feeling slightly better. Found to have cdifficile pcr + this morning  Medications:  . cefTRIAXone (ROCEPHIN)  IV  2 g Intravenous Q24H  . heparin  5,000 Units Subcutaneous 3 times per day  . metroNIDAZOLE  500 mg Oral 4 times per day  . sodium chloride  3 mL Intravenous Q12H  . vancomycin  750 mg Intravenous Q12H    Objective: Vital signs in last 24 hours: Temp:  [98 F (36.7 C)-99.2 F (37.3 C)] 98 F (36.7 C) (11/20 0616) Pulse Rate:  [70-86] 70 (11/20 0616) Resp:  [16-18] 16 (11/20 0616) BP: (103-115)/(58-65) 108/60 mmHg (11/20 0616) SpO2:  [98 %-99 %] 98 % (11/20 0616) Weight:  [137 lb 5.6 oz (62.3 kg)] 137 lb 5.6 oz (62.3 kg) (11/20 0500) Physical Exam  Constitutional:  oriented to person, place, and time. appears well-developed and well-nourished. No distress.  HENT:  Mouth/Throat: Oropharynx is clear and moist. No oropharyngeal exudate.  Cardiovascular: Normal rate, regular rhythm and normal heart sounds. Exam reveals no gallop and no friction rub.  No murmur heard.  Pulmonary/Chest: Effort normal and breath sounds normal. No respiratory distress.  has no wheezes.  Abdominal: Soft. Bowel sounds are normal.  exhibits no distension. There is no tenderness.  Lymphadenopathy: no cervical adenopathy.  Neurological: alert and oriented to person, place, and time.  Skin: Skin is warm and dry. No rash noted. No erythema.  Psychiatric: a normal mood and affect. behavior is normal.    Lab Results  Recent Labs  12/09/13 2123 12/10/13 0001 12/10/13 0640  WBC 10.4  --  8.6  HGB 11.4* 13.3 11.1*  HCT 34.0* 39.0 33.2*  NA  --  137 142  K  --  3.7 3.7  CL  --  102 106  CO2  --   --  23  BUN  --  10 9  CREATININE  --  0.70 0.75   Liver Panel  Recent Labs  12/10/13 0640  PROT 6.1  ALBUMIN 3.2*  AST 23  ALT 17  ALKPHOS 79  BILITOT 0.6   Sedimentation Rate  Recent Labs  12/10/13 0640  ESRSEDRATE 12   C-Reactive Protein  Recent Labs  12/10/13 0640  CRP 3.2*    Microbiology: + c.difficile  Studies/Results: No results found.   Assessment/Plan: Diarrhea = c.difficile +, will start metronidazole 500mg  TID x 14 days, and may need to extend further if still having diarrhea at the end of the course of therapy. If diarrhea not improved after 3 days, may need to change to oral vancomycin  Foot osteo = on vanco and ceftriaxone 2gm IV daily as previously prescribed  Fever = will continue with prn tylenol, afebrile.  Will sign off. Will have her follow up with dr. Zenaida NieceVan dam in 2wk   Carrie [name], [facility] Cell: [phone] Pager:  [phone]  12/12/2013, 12:52 PM

## 2013-12-13 MED ORDER — SACCHAROMYCES BOULARDII 250 MG PO CAPS: 250.0000 mg | ORAL_CAPSULE | Freq: Two times a day (BID) | ORAL | Status: DC

## 2013-12-13 MED ORDER — HEPARIN SOD (PORK) LOCK FLUSH 100 UNIT/ML IV SOLN
250.0000 [IU] | INTRAVENOUS | Status: AC | PRN
  Administered 2013-12-13: 250 [IU]

## 2013-12-13 MED ORDER — METRONIDAZOLE 500 MG PO TABS: 500.0000 mg | ORAL_TABLET | Freq: Three times a day (TID) | ORAL | Status: DC

## 2013-12-13 NOTE — Plan of Care (Signed)
Problem: Phase I Progression Outcomes Goal: Hemodynamically stable Outcome: Completed/Met Date Met:  12/13/13  Problem: Phase II Progression Outcomes Goal: IV changed to normal saline lock Outcome: Completed/Met Date Met:  12/13/13  Problem: Phase III Progression Outcomes Goal: Pain controlled on oral analgesia Outcome: Completed/Met Date Met:  12/13/13 Goal: Activity at appropriate level-compared to baseline (UP IN CHAIR FOR HEMODIALYSIS)  Outcome: Completed/Met Date Met:  12/13/13 Goal: Voiding independently Outcome: Completed/Met Date Met:  12/13/13 Goal: IV/normal saline lock discontinued Outcome: Not Applicable Date Met:  18/56/31 Goal: Foley discontinued Outcome: Not Applicable Date Met:  49/70/26 Goal: Discharge plan remains appropriate-arrangements made Outcome: Completed/Met Date Met:  12/13/13 Goal: Other Phase III Outcomes/Goals Outcome: Not Applicable Date Met:  37/85/88  Problem: Discharge Progression Outcomes Goal: Discharge plan in place and appropriate Outcome: Completed/Met Date Met:  12/13/13 Goal: Pain controlled with appropriate interventions Outcome: Completed/Met Date Met:  12/13/13 Goal: Hemodynamically stable Outcome: Completed/Met Date Met:  50/27/74 Goal: Complications resolved/controlled Outcome: Completed/Met Date Met:  12/13/13 Goal: Tolerating diet Outcome: Completed/Met Date Met:  12/13/13 Goal: Activity appropriate for discharge plan Outcome: Completed/Met Date Met:  12/13/13 Goal: Other Discharge Outcomes/Goals Outcome: Not Applicable Date Met:  12/87/86

## 2013-12-13 NOTE — Progress Notes (Signed)
ANTIBIOTIC CONSULT NOTE - FOLLOW UP  Pharmacy Consult for vancomycin Indication: osteomyelitis   Allergies  Allergen Reactions  . Phenazopyridine Other (See Comments)    Racing heart, pass out  . Pyridium [Phenazopyridine Hcl] Other (See Comments)    Racing heart, passing out    Patient Measurements: Height: 5\' 3"  (160 cm) Weight: 137 lb 13.7 oz (62.53 kg) IBW/kg (Calculated) : 52.4  Vital Signs: Temp: 98.4 F (36.9 C) (11/21 0508) Temp Source: Oral (11/21 0508) BP: 131/70 mmHg (11/21 0508) Pulse Rate: 67 (11/21 0508) Intake/Output from previous day: 11/20 0701 - 11/21 0700 In: 1970 [P.O.:720; I.V.:900; IV Piggyback:350] Out: -  Intake/Output from this shift:    Labs: No results for input(s): WBC, HGB, PLT, LABCREA, CREATININE in the last 72 hours. Estimated Creatinine Clearance: 64.2 mL/min (by C-G formula based on Cr of 0.75). No results for input(s): VANCOTROUGH, VANCOPEAK, VANCORANDOM, GENTTROUGH, GENTPEAK, GENTRANDOM, TOBRATROUGH, TOBRAPEAK, TOBRARND, AMIKACINPEAK, AMIKACINTROU, AMIKACIN in the last 72 hours.   Microbiology: Recent Results (from the past 720 hour(s))  Blood culture (routine x 2)     Status: None (Preliminary result)   Collection Time: 12/09/13 11:50 PM  Result Value Ref Range Status   Specimen Description BLOOD LEFT ARM  Final   Special Requests BOTTLES DRAWN AEROBIC AND ANAEROBIC 10CC  Final   Culture  Setup Time   Final    12/10/2013 03:11 Performed at [facility]    Culture   Final           BLOOD CULTURE RECEIVED NO GROWTH TO DATE CULTURE WILL BE HELD FOR 5 DAYS BEFORE ISSUING A FINAL NEGATIVE REPORT Performed at [facility]    Report Status PENDING  Incomplete  Blood culture (routine x 2)     Status: None (Preliminary result)   Collection Time: 12/09/13 11:55 PM  Result Value Ref Range Status   Specimen Description BLOOD LEFT HAND  Final   Special Requests BOTTLES DRAWN AEROBIC ONLY 3CC  Final   Culture  Setup Time    Final    12/10/2013 03:11 Performed at [facility]    Culture   Final           BLOOD CULTURE RECEIVED NO GROWTH TO DATE CULTURE WILL BE HELD FOR 5 DAYS BEFORE ISSUING A FINAL NEGATIVE REPORT Performed at [facility]    Report Status PENDING  Incomplete  Clostridium Difficile by PCR     Status: Abnormal   Collection Time: 12/11/13  5:37 PM  Result Value Ref Range Status   C difficile by pcr POSITIVE (A) NEGATIVE Final    Comment: CRITICAL RESULT CALLED TO, READ BACK BY AND VERIFIED WITH: Larose KellsK. FRALEY RN 10:35 12/12/13 ([email or web address])     Anti-infectives    Start     Dose/Rate Route Frequency Ordered Stop   12/13/13 0000  metroNIDAZOLE (FLAGYL) 500 MG tablet     500 mg Oral Every 8 hours 12/13/13 1249     12/12/13 2200  metroNIDAZOLE (FLAGYL) tablet 500 mg     500 mg Oral 3 times per day 12/12/13 1437 12/26/13 1359   12/12/13 1800  vancomycin (VANCOCIN) IVPB 750 mg/150 ml premix  Status:  Discontinued     750 mg150 mL/hr over 60 Minutes Intravenous Every 12 hours 12/12/13 1054 12/12/13 1103   12/12/13 1600  vancomycin (VANCOCIN) IVPB 750 mg/150 ml premix     750 mg150 mL/hr over 60 Minutes Intravenous Every 12 hours 12/12/13 1103     12/12/13  1200  metroNIDAZOLE (FLAGYL) tablet 500 mg  Status:  Discontinued     500 mg Oral 4 times per day 12/12/13 1047 12/12/13 1437   12/12/13 1200  cefTRIAXone (ROCEPHIN) 2 g in dextrose 5 % 50 mL IVPB - Premix     2 g100 mL/hr over 30 Minutes Intravenous Every 24 hours 12/12/13 1050     12/10/13 1600  vancomycin (VANCOCIN) IVPB 750 mg/150 ml premix  Status:  Discontinued     750 mg150 mL/hr over 60 Minutes Intravenous Every 12 hours 12/10/13 1314 12/12/13 1047   12/10/13 0800  piperacillin-tazobactam (ZOSYN) IVPB 3.375 g  Status:  Discontinued     3.375 g12.5 mL/hr over 240 Minutes Intravenous 3 times per day 12/10/13 0642 12/12/13 1047   12/10/13 0630  ciprofloxacin (CIPRO) IVPB 400 mg  Status:  Discontinued     400 mg200 mL/hr over 60  Minutes Intravenous 2 times daily 12/10/13 0557 12/10/13 0935   12/10/13 0600  cefTRIAXone (ROCEPHIN) 2 g in dextrose 5 % 50 mL IVPB - Premix  Status:  Discontinued     2 g100 mL/hr over 30 Minutes Intravenous Daily 12/10/13 0458 12/10/13 0627   12/10/13 0300  vancomycin (VANCOCIN) IVPB 1000 mg/200 mL premix     1,000 mg200 mL/hr over 60 Minutes Intravenous  Once 12/10/13 0259 12/10/13 0404      Assessment: 58 yo F with fevers, osteomyelitis and PICC line in place. Receiving rocephin 2g IV daily and Vanc at unknown does twice daily at home. Pharmacy consulted to continue vanc while inpatient. Random vancomycin level 15.3 on 11/18. WBC 8.6, currently afebrile. Blood cultures no growth to date. Renal function stable.  Goal of Therapy:  Vancomycin trough level 15-20 mcg/ml  Plan:  Cont Vancomycin 750 mg IV q12h per pharmacy Rocephin 2g IV q24 Flagyl 500mg  PO q6 Monitor renal function and clinical progress [facility]  Thank you for allowing pharmacy to be part of this patient's care team  Orris Perin M. Paidyn Mcferran, Pharm.D Clinical Pharmacy Resident Pager: [phone] 12/13/2013 .2:31 PM

## 2013-12-13 NOTE — Discharge Summary (Signed)
Physician Discharge Summary  Carrie [name] ZOX:[id number] DOB: 07/29/1955 DOA: 12/09/2013  PCP: Hadley Pen, MD  Admit date: 12/09/2013 Discharge date: 12/13/2013  Time spent: >30 minutes  Recommendations for Outpatient Follow-up:  Check CBC and BMET (follow WBC's, electrolytes and renal function)  Discharge Diagnoses:  Principal Problem:   Sepsis Active Problems:   Osteomyelitis   Fever   Obstructive sleep apnea   Enteritis due to Clostridium difficile   Discharge Condition: stable and improved. Diarrhea slowing down. No nausea, no vomiting, no fever. Will be discharge home with [facility] services.  Diet recommendation: regular diet   Filed Weights   12/11/13 0533 12/12/13 0500 12/13/13 0500  Weight: 62.37 kg (137 lb 8 oz) 62.3 kg (137 lb 5.6 oz) 62.53 kg (137 lb 13.7 oz)    History of present illness:  58 y/o female with hx of OSA and osteomyelitis (receiving IV antibiotics); presented with fever, nausea/vomiting and sepsis picture. Please referred to H&P dictated by Dr. Conard Novak Course:  1-SIRS/SEPSIS: due to C. Diff infection. Patient on abx's use for osteomyelitis. -no elevated WBC's -results positive for C. Diff; will treat with flagyl as indicated by ID (with intentions to extend treatment up to 7 days past finishing antibiotic therapy for osteomyelitis). -influenza neg, CXR unrevealing and UA neg for UTI -per ID treat with flagyl and continue vanc/rocephin for osteomyelitis as previously taking; will follow with ID  2-FUO: due to c. Diff. Will treat as mentioned above. -patient afebrile for 36 hours before discharge.  3-OSA: continue CPAP at bedtime  4-HA's: improved/resolved after treatment with phenergan and benadryl -most likely due to lack of home CPAP -no HA at discharge  5-osteomyelitis: plan is to continue IV vanc/rocephin  6-Enteritis due to C. Diff: Will treat along with flagyl. Will discharge on florastor.  Procedures: See below for x-ray  reports  Consultations:  ID  Discharge Exam: Filed Vitals:   12/13/13 0508  BP: 131/70  Pulse: 67  Temp: 98.4 F (36.9 C)  Resp: 16    General: AAOX3, improve HA's; patient has remained afebrile; diarrhea started to slow down and stool is forming  Cardiovascular: S1 and S2, no rubs or gallops  Respiratory: CTA bilaterally  Abdomen: soft, NT, ND, positive BS  Musculoskeletal: no edema or cyanosis; right big toe with mild erythema and swelling; no drainage or angry appereance.   Discharge Instructions You were cared for by a hospitalist during your hospital stay. If you have any questions about your discharge medications or the care you received while you were in the hospital after you are discharged, you can call the unit and asked to speak with the hospitalist on call if the hospitalist that took care of you is not available. Once you are discharged, your primary care physician will handle any further medical issues. Please note that NO REFILLS for any discharge medications will be authorized once you are discharged, as it is imperative that you return to your primary care physician (or establish a relationship with a primary care physician if you do not have one) for your aftercare needs so that they can reassess your need for medications and monitor your lab values.  Discharge Instructions    Discharge instructions    Complete by:  As directed   Take medications as prescribed Follow with Dr. Daiva Eves in 1 week Maintain yourself well hydrated Follow good hands hygiene practice          Current Discharge Medication List    START taking  these medications   Details  metroNIDAZOLE (FLAGYL) 500 MG tablet Take 1 tablet (500 mg total) by mouth every 8 (eight) hours. Qty: 90 tablet, Refills: 0    saccharomyces boulardii (FLORASTOR) 250 MG capsule Take 1 capsule (250 mg total) by mouth 2 (two) times daily. Qty: 60 capsule, Refills: 0      CONTINUE these medications which  have NOT CHANGED   Details  PRESCRIPTION MEDICATION Inject 1 Applicatorful as directed 2 (two) times daily. Ceftriaxone sodium 2Gm in 20ml  Heparin 10units/ml 5ml syringe Normal saline flush 0.9% 10ml syringe Vancomycin 1250mg  in 262ml NS      STOP taking these medications     polyethylene glycol (MIRALAX / GLYCOLAX) packet      ciprofloxacin (CIPRO) 500 MG tablet      clindamycin (CLEOCIN) 300 MG capsule        Allergies  Allergen Reactions  . Phenazopyridine Other (See Comments)    Racing heart, pass out  . Pyridium [Phenazopyridine Hcl] Other (See Comments)    Racing heart, passing out   Follow-up Information    Follow up with [facility].   Why:  current with services for home iv antibiotics   Contact information:   [address] [place] [address] [phone]       Follow up with Acey Lavornelius Van Dam, MD In 1 week.   Specialty:  Infectious Diseases   Contact information:   [address] [place] [address] [phone]       The results of significant diagnostics from this hospitalization (including imaging, microbiology, ancillary and laboratory) are listed below for reference.    Significant Diagnostic Studies: Dg Chest 2 View  12/10/2013   CLINICAL DATA:  Fever, nausea and vomiting, no chest complaints. To check PICC line.  EXAM: CHEST  2 VIEW  COMPARISON:  Chest fluoroscopy 12/03/2013  FINDINGS: Right PICC line with tip projected over the upper SVC region. No pneumothorax. Mild hyperinflation. Normal heart size and pulmonary vascularity. No focal airspace disease or consolidation in the lungs. No blunting of costophrenic angles. Mediastinal contours appear intact.  IMPRESSION: PICC line tip projects over the upper SVC. No pneumothorax. Lungs clear. Mild hyperinflation.   Electronically Signed   By: Burman NievesWilliam  [name] M.D.   On: 12/10/2013 00:13   Ir Fluoro Guide Cv Line Right  12/03/2013   CLINICAL  DATA:  58 year old female with osteomyelitis. Referred for PICC placement  EXAM: RIGHT BRACHIAL PICC LINE PLACEMENT WITH ULTRASOUND AND FLUOROSCOPIC GUIDANCE  FLUOROSCOPY TIME:  1 min 12 seconds  PROCEDURE: The patient was advised of the possible risks and complications and agreed to undergo the procedure. The patient was then brought to the angiographic suite for the procedure.  The right arm was prepped with chlorhexidine, draped in the usual sterile fashion using maximum barrier technique (cap and mask, sterile gown, sterile gloves, large sterile sheet, hand hygiene and cutaneous antisepsis) and infiltrated locally with 1% Lidocaine.  Ultrasound demonstrated patency of the right brachial vein, and this was documented with an image. Under real-time ultrasound guidance, this vein was accessed with a 21 gauge micropuncture needle and image documentation was performed. A 0.018 wire was introduced in to the vein. Over this, a 5 [place] single lumen power injectable PICC was advanced to the lower SVC/right atrial junction. Fluoroscopy during the procedure and fluoro spot radiograph confirms appropriate catheter position. The catheter was flushed and covered with a sterile dressing.  Catheter length: 35 cm  Complications: None  COMPLICATIONS: None  IMPRESSION: Status post right-sided brachial vein single-lumen power injectable PICC catheter measuring 35 cm. Catheter ready for use.  Signed,  Yvone Neu. Loreta Ave, DO  [facility]  [facility]   Electronically Signed   By: Gilmer Mor D.O.   On: 12/03/2013 09:51   Ir US Guide Vasc Access Right  12/03/2013   CLINICAL DATA:  58 year old female with osteomyelitis. Referred for PICC placement  EXAM: RIGHT BRACHIAL PICC LINE PLACEMENT WITH ULTRASOUND AND FLUOROSCOPIC GUIDANCE  FLUOROSCOPY TIME:  1 min 12 seconds  PROCEDURE: The patient was advised of the possible risks and complications and agreed to undergo the procedure. The  patient was then brought to the angiographic suite for the procedure.  The right arm was prepped with chlorhexidine, draped in the usual sterile fashion using maximum barrier technique (cap and mask, sterile gown, sterile gloves, large sterile sheet, hand hygiene and cutaneous antisepsis) and infiltrated locally with 1% Lidocaine.  Ultrasound demonstrated patency of the right brachial vein, and this was documented with an image. Under real-time ultrasound guidance, this vein was accessed with a 21 gauge micropuncture needle and image documentation was performed. A 0.018 wire was introduced in to the vein. Over this, a 5 [place] single lumen power injectable PICC was advanced to the lower SVC/right atrial junction. Fluoroscopy during the procedure and fluoro spot radiograph confirms appropriate catheter position. The catheter was flushed and covered with a sterile dressing.  Catheter length: 35 cm  Complications: None  COMPLICATIONS: None  IMPRESSION: Status post right-sided brachial vein single-lumen power injectable PICC catheter measuring 35 cm. Catheter ready for use.  Signed,  Yvone Neu. Loreta Ave, DO  [facility]  [facility]   Electronically Signed   By: Gilmer Mor D.O.   On: 12/03/2013 09:51   Dg Foot Complete Right  12/10/2013   CLINICAL DATA:  Wound in the first toe.  Pain.  Fever.  Infection.  EXAM: RIGHT FOOT COMPLETE - 3+ VIEW  COMPARISON:  11/14/2013  FINDINGS: Mild hallux valgus deformity. No evidence of acute fracture or dislocation of the right foot. No focal bone lesion or bone destruction. Bone cortex appears intact. No cortical erosion or sclerosis to suggest osteomyelitis. Soft tissues are unremarkable.  IMPRESSION: Mild hallux valgus deformity. No acute bony abnormalities. No changes to suggest osteomyelitis.   Electronically Signed   By: Burman Nieves M.D.   On: 12/10/2013 02:38   Dg Foot Complete Right  11/18/2013   3 views of a skeletally  mature individual were obtained of the right foot.  Studies includes DP, oblique, lateral projection.  There is evidence of changes of the distal phalanx status post bone  biopsy. Mild HAV deformity.     Microbiology: Recent Results (from the past 240 hour(s))  Blood culture (routine x 2)     Status: None (Preliminary result)   Collection Time: 12/09/13 11:50 PM  Result Value Ref Range Status   Specimen Description BLOOD LEFT ARM  Final   Special Requests BOTTLES DRAWN AEROBIC AND ANAEROBIC 10CC  Final   Culture  Setup Time   Final    12/10/2013 03:11 Performed at [facility]    Culture   Final           BLOOD CULTURE RECEIVED NO GROWTH TO DATE CULTURE WILL BE HELD FOR 5 DAYS BEFORE ISSUING A FINAL NEGATIVE REPORT Performed at [facility]    Report Status PENDING  Incomplete  Blood culture (routine x 2)     Status: None (Preliminary result)   Collection Time: 12/09/13 11:55 PM  Result Value Ref Range Status   Specimen Description BLOOD LEFT HAND  Final   Special Requests BOTTLES DRAWN AEROBIC ONLY 3CC  Final   Culture  Setup Time   Final    12/10/2013 03:11 Performed at [facility]    Culture   Final           BLOOD CULTURE RECEIVED NO GROWTH TO DATE CULTURE WILL BE HELD FOR 5 DAYS BEFORE ISSUING A FINAL NEGATIVE REPORT Performed at [facility]    Report Status PENDING  Incomplete  Clostridium Difficile by PCR     Status: Abnormal   Collection Time: 12/11/13  5:37 PM  Result Value Ref Range Status   C difficile by pcr POSITIVE (A) NEGATIVE Final    Comment: CRITICAL RESULT CALLED TO, READ BACK BY AND VERIFIED WITH: Larose KellsK. FRALEY RN 10:35 12/12/13 ([email or web address])      Labs: Basic Metabolic Panel:  Recent Labs Lab 12/10/13 0001 12/10/13 0640  NA 137 142  K 3.7 3.7  CL 102 106  CO2  --  23  GLUCOSE 120* 106*  BUN 10 9  CREATININE 0.70 0.75  CALCIUM  --  8.8   Liver Function Tests:  Recent Labs Lab 12/10/13 0640  AST 23  ALT 17   ALKPHOS 79  BILITOT 0.6  PROT 6.1  ALBUMIN 3.2*   CBC:  Recent Labs Lab 12/09/13 2123 12/10/13 0001 12/10/13 0640  WBC 10.4  --  8.6  NEUTROABS 9.0*  --   --   HGB 11.4* 13.3 11.1*  HCT 34.0* 39.0 33.2*  MCV 86.3  --  89.0  PLT 179  --  159   Signed:  Vassie LollMadera, Kisha Messman  [facility] 12/13/2013, 12:53 PM

## 2013-12-13 NOTE — Progress Notes (Signed)
Patient reports no bowel movements during the night. No complaints at this time.

## 2013-12-13 NOTE — Plan of Care (Signed)
Problem: Phase I Progression Outcomes Goal: Initial discharge plan identified Outcome: Completed/Met Date Met:  12/13/13 Goal: Hemodynamically stable Outcome: Progressing  Problem: Phase II Progression Outcomes Goal: Discharge plan established Outcome: Completed/Met Date Met:  12/13/13 Goal: Vital signs remain stable Outcome: Completed/Met Date Met:  12/13/13 Goal: IV changed to normal saline lock Outcome: Progressing Goal: Obtain order to discontinue catheter if appropriate Outcome: Not Applicable Date Met:  85/46/27 Goal: Other Phase II Outcomes/Goals Outcome: Not Applicable Date Met:  03/50/09

## 2013-12-13 NOTE — Progress Notes (Signed)
Pt. Received discharge instructions and prescriptions. Educated pt. On follow-up appointments and when to take Florastor and Flagyl. IV nurse Hep. Locked PICC line and capped off. No skin issues noted. All questions answered. No further needs noted at this time.

## 2013-12-16 ENCOUNTER — Telehealth: Payer: Self-pay | Admitting: *Deleted

## 2013-12-16 ENCOUNTER — Other Ambulatory Visit: Payer: Self-pay | Admitting: *Deleted

## 2013-12-16 LAB — CULTURE, BLOOD (ROUTINE X 2)
CULTURE: NO GROWTH
CULTURE: NO GROWTH

## 2013-12-16 MED ORDER — ONDANSETRON HCL 4 MG PO TABS: 4.0000 mg | ORAL_TABLET | Freq: Three times a day (TID) | ORAL | Status: DC | PRN

## 2013-12-16 NOTE — Telephone Encounter (Signed)
Patient called stating she was recently hospitalized and developed c-diff, she was prescribed Flagyl and is now having severe nausea. She said she has been able to eat very little since being discharged. She is requesting an Rx; please advise.

## 2013-12-16 NOTE — Telephone Encounter (Signed)
Patient notified and Rx called to pharmacy.  

## 2013-12-16 NOTE — Telephone Encounter (Signed)
She can have zofran 4mg  every six hours. She should make sure she drinks ZERO alcohol on the flagyl as this will make her severely nauseaous and sick. We could also change her to vancomycin 125mg  po four times daily. The pill form is very expensive though

## 2013-12-19 ENCOUNTER — Telehealth: Payer: Self-pay | Admitting: Internal Medicine

## 2013-12-19 DIAGNOSIS — A0472 Enterocolitis due to Clostridium difficile, not specified as recurrent: Secondary | ICD-10-CM

## 2013-12-19 MED ORDER — VANCOMYCIN HCL 125 MG PO CAPS: 125.0000 mg | ORAL_CAPSULE | Freq: Four times a day (QID) | ORAL | Status: DC

## 2013-12-19 NOTE — Telephone Encounter (Signed)
I received a telephone call from Ms. Carrie [name] this morning. She has been on IV antibiotic therapy and developed C. difficile colitis recently. She was discharged on oral metronidazole but is having problems with severe nausea and vomiting related to the metronidazole. I agreed to switch her to oral vancomycin. She is due to follow-up with my partner, Dr. Daiva EvesVan Dam, in 2 weeks. She may need to extend therapy with vancomycin to one week beyond completion of her IV antibiotic therapy.

## 2014-01-05 ENCOUNTER — Ambulatory Visit (INDEPENDENT_AMBULATORY_CARE_PROVIDER_SITE_OTHER): Payer: BC Managed Care – PPO | Admitting: Infectious Disease

## 2014-01-05 ENCOUNTER — Encounter: Payer: Self-pay | Admitting: Infectious Disease

## 2014-01-05 VITALS — BP 119/83 | HR 80 | Temp 98.0°F | Wt 133.0 lb

## 2014-01-05 DIAGNOSIS — M86172 Other acute osteomyelitis, left ankle and foot: Secondary | ICD-10-CM | POA: Diagnosis not present

## 2014-01-05 DIAGNOSIS — M869 Osteomyelitis, unspecified: Secondary | ICD-10-CM

## 2014-01-05 DIAGNOSIS — A047 Enterocolitis due to Clostridium difficile: Secondary | ICD-10-CM

## 2014-01-05 DIAGNOSIS — A0472 Enterocolitis due to Clostridium difficile, not specified as recurrent: Secondary | ICD-10-CM

## 2014-01-05 NOTE — Progress Notes (Signed)
Subjective:    Patient ID: Carrie [name], female    DOB: 10/15/1955, 58 y.o.   MRN: [id number]  HPI   58 year old with toe injury (not sure mechanism thinks it was due to carpet tack maybe?). Was sore on outside. And underneath, toenail swollen. Turned red. Pt cut nail, went back, went to Dr. Gretta Arab on August 17th.  He cut out toenail and put pt on abx , took for 7 days. Infection was worse. He removed nail more. Pt started bactrim DS BID. Pt went in and was not much improved. Pt "messed with scab pus and blood came out. Called [facility] back. Bactrim DS bid again which helped but recurred again off of the bactrim. Went on the 21st of September. 2 days later much worse. Pt then went to see Dr. Jacqualyn Posey. Dr. Jacqualyn Posey saw her did plain films suspected osteomyelitis and started cipro and clindamycin.  Pt had MRI in October which showed bone Marrow edema throughout the distal phalanx of the great toe is consistent with osteomyelitis Pt was off the clinda and cipro for nearly 2 weeks. Had surgery on Monday. Back on cipro and clindamycin.   Cultures yielded no organisms and pathology was read as NOT being consistent with osteomyelitis but we felt we should treat for osteomyelitis nonetheless. After being on cipro and clinda for further protracted time period ultimately she agreed to IV abx and she started on vancomycin and ceftriaxone on 12/06/13. After having been on the antibiotics she developed fever and was admitted to the hospital and where initial workup was completely negative. Ultimately she began having profuse diarrhea and C difficile PCR positive, was treated with flagyl but did not tolerate well due to nausea then to oral vancomycin which she finished. She is now having 4 bm per day that are formed.'  Her foot his hurting more than it did several weeks ago though she is walking on it at work. The toe becomes red during the day when she walks no it and then improves off of it. No fevers but she feels  fatigued most of the time. Also some pain irritation in her gums.    Review of Systems  Constitutional: Positive for fatigue. Negative for fever, chills, activity change and appetite change.  HENT: Positive for mouth sores. Negative for hearing loss, sinus pressure and sore throat.   Eyes: Negative for photophobia and visual disturbance.  Respiratory: Negative for choking, chest tightness, shortness of breath and wheezing.   Gastrointestinal: Negative for abdominal pain, constipation and abdominal distention.  Endocrine: Negative for cold intolerance.  Genitourinary: Negative for dysuria, flank pain and difficulty urinating.  Musculoskeletal: Negative for back pain and arthralgias.  Skin: Positive for color change and wound.  Neurological: Negative for dizziness and headaches.  Hematological: Negative for adenopathy. Does not bruise/bleed easily.  Psychiatric/Behavioral: Negative for behavioral problems, confusion, dysphoric mood, decreased concentration and agitation.       Objective:   Physical Exam  Constitutional: She is oriented to person, place, and time. She appears well-developed and well-nourished. No distress.  HENT:  Head: Normocephalic and atraumatic.  Mouth/Throat: No oropharyngeal exudate.  Eyes: Conjunctivae and EOM are normal. No scleral icterus.  Neck: Normal range of motion. Neck supple.  Cardiovascular: Normal rate and regular rhythm.   Pulmonary/Chest: Effort normal. No respiratory distress. She has no wheezes.  Abdominal: She exhibits no distension.  Musculoskeletal: She exhibits no edema.  Neurological: She is alert and oriented to person, place, and time. She exhibits  normal muscle tone. Coordination normal.  Skin: Skin is warm and dry. She is not diaphoretic. No pallor.  Psychiatric: She has a normal mood and affect. Her behavior is normal. Judgment and thought content normal.      Toe today 01/05/14 healed up site from biopsy, not especially  erythematous             Assessment & Plan:  Osteomyelitis of toe: Her foot feels worse and she still has fatigue. Strange case with biopsy NOT showing osteomyelitis, cultures with abx held negative but MRI positive and pt still with significant pain. Most recent plain films did not show osteomyelitis  I will get repeat MRI of her foot and recheck her ESR and CRP  Continue to finish up her rocephin and vancomycin  IF MRI shows worsening osteomyelitis will need to take her off antibiotics and have Dr. Jacqualyn Posey or Orthopedics go in and peform more aggressive surgery  IF MRI without much change would vote to stop her antibiotics and observe off antibiotics  She needs to get in and see Dr. Jacqualyn Posey. Some of her persistent pain may also be due to her walking on this foot with tennis shoes at work.    I spent greater than 40 minutes with the patient including greater than 50% of time in face to face counsel of the patient and in coordination of their care.   Clostridium difficile: avoid Clindamycin, cipro, levaquin AT ALL COSTS in future. Certainly at high risk for relapse on cephalosporin she is still taking with her IV vancomycin

## 2014-01-12 ENCOUNTER — Ambulatory Visit: Payer: BC Managed Care – PPO | Admitting: Infectious Disease

## 2014-01-12 ENCOUNTER — Telehealth: Payer: Self-pay | Admitting: *Deleted

## 2014-01-12 NOTE — Telephone Encounter (Signed)
Location of MRI changed.  Authorized [id number], [facility]  [facility] [phone].

## 2014-01-14 ENCOUNTER — Ambulatory Visit (INDEPENDENT_AMBULATORY_CARE_PROVIDER_SITE_OTHER): Payer: BC Managed Care – PPO | Admitting: Podiatry

## 2014-01-14 ENCOUNTER — Encounter: Payer: Self-pay | Admitting: Podiatry

## 2014-01-14 ENCOUNTER — Telehealth: Payer: Self-pay | Admitting: *Deleted

## 2014-01-14 ENCOUNTER — Ambulatory Visit (INDEPENDENT_AMBULATORY_CARE_PROVIDER_SITE_OTHER): Payer: BC Managed Care – PPO

## 2014-01-14 VITALS — BP 114/54 | HR 82 | Resp 18

## 2014-01-14 DIAGNOSIS — R52 Pain, unspecified: Secondary | ICD-10-CM

## 2014-01-14 DIAGNOSIS — M869 Osteomyelitis, unspecified: Secondary | ICD-10-CM

## 2014-01-14 NOTE — Progress Notes (Signed)
Patient ID: Carrie [name], female   DOB: 1955-03-03, 58 y.o.   MRN: [id number]  Subjective: 58 year old female returns the office today for further evaluation of right hallux pain and for osteomyelitis of the right hallux. Since last appointment she has had complications of antibiotics and has been hospitalized. She states that the toe periodically becomes red and swollen depending on how much she is on it in the day. She denies any systemic complaints as fevers, chills, nausea, vomiting this time. She finishes her course of IV antibiotics today. [facility]. She states that after starting the antibiotics that she was having increased stinging to the toes for which she related corresponding to the time of receiving an infusion. No other complaints at this time.  Objective: AAO 3, NAD DP/PT pulses palpable bilaterally, CRT less than 3 seconds Protective sensation intact with Simms Weinstein monofilament, vibratory sensation intact Incisions on the distal as well as the medial aspect of the right hallux are well coapted and are healed at this time. There is mild edema and erythema over the hallux compared to the contralateral extremity. Clinically, the toe has the best appearance that I have seen since I started seeing her. Majority of the pain is over the level of the IPJ of the hallux. There is no open lesions identified. There is no areas of fluctuance or crepitus. There is no ascending cellulitis. There is no pain with MTPJ range of motion.  MRI 01/14/04: FINDINGS: There is inflammation of the subcutaneous fat at the plantar and lateral aspects of the fifth metatarsal phalangeal joint consistent with developing adventitial bursitis. The joint is normal. The bones are normal.  The other bones of the forefoot are normal other than osteoarthritic changes at the first metatarsophalangeal joint and interphalangeal joint of the great toe.  The muscles and  tendons appear normal. There is no osteomyelitis. The patient does have a tiny metallic foreign body in the dorsum of the foot overlying the middle cuneiform seen on image 18 of series 15. This is not visible on radiographs and is probably very tiny.  IMPRESSION: Developing adventitial bursitis at the fifth metatarsal phalangeal Joint.  MRI 10/25/13: FINDINGS: There appears to be a skin ulceration along the medial aspect of the right great toe. There is marrow edema throughout the distal phalanx consistent with osteomyelitis. No effusion at the IP joint of the great toe is identified. No abscess is seen. The patient has first MTP osteoarthritis. Imaged bones and joints otherwise appear normal.  IMPRESSION: Marrow edema throughout the distal phalanx of the great toe is consistent with osteomyelitis. There appears to be a skin ulceration along the medial aspect of the distal phalanx. Negative for abscess.  Assessment: 58 year old female right hallux osteomyelitis of the distal phalanx.   Plan : -New x-rays were obtained and reviewed with the patient this time. -MRI which is obtained yesterday was reviewed with the patient as well. On the findings of the MRI it states that there is no osteomyelitis. However, upon evaluation of the images I feel as if on the T2 images there is still some marrow edema within the distal phalanx, although it does appear to be improved compared to previous. I have sent the MRI out for a 2nd opinion.  -At this time the patient just finished her antibiotics today. At this time she has been on antibiotics the last couple of months and theoretically the infection should be treated. We can observe off of the  antibiotics to see how she responds. At this time with the MRI findings of no osteomyelitis, I don't feel that another surgery/debridement would be beneficial.  -Will call the patient once I receive the MRI over read. In the meantime, follow-up as well as Dr. Zenaida NieceVan  [name].  -Call the office with any questions/concerns/change in symptoms.

## 2014-01-16 NOTE — Progress Notes (Signed)
Thanks [employer]!  I think it is fine she comes off the abx for now and we can observe her

## 2014-01-21 ENCOUNTER — Encounter: Payer: Self-pay | Admitting: Infectious Disease

## 2014-01-21 NOTE — Telephone Encounter (Signed)
-----   Message from Ovid CurdMatthew Wagoner, DPM sent at 01/18/2014  4:59 PM EST ----- Regarding: MRI over read Last week I asked Misty StanleyLisa to send out the most recent MRI for an over read. The MRI was done at [facility] and was ordered by Dr. Daiva EvesVan Dam. Could you please follow-up on this. Thanks.

## 2014-01-21 NOTE — Telephone Encounter (Signed)
MRI disk was sent to [facility] for a re-read per Dr. Ardelle AntonWagoner.

## 2014-01-26 ENCOUNTER — Telehealth: Payer: Self-pay | Admitting: *Deleted

## 2014-01-26 ENCOUNTER — Encounter: Payer: Self-pay | Admitting: Infectious Disease

## 2014-01-26 ENCOUNTER — Telehealth: Payer: Self-pay | Admitting: Licensed Clinical Social Worker

## 2014-01-26 NOTE — Telephone Encounter (Signed)
"  I just need to know what course of action we're going to take at this point.  I need to do something about the [facility], I'm sure they're going to continue to need orders if I keep it in to change the bandage etc.  I'm just not sure what the plan of action is.  Thank you."

## 2014-01-26 NOTE — Telephone Encounter (Signed)
Gave Delydia the CD to have it over read per Dr Ardelle Anton. Hadley Pen

## 2014-01-26 NOTE — Telephone Encounter (Signed)
I called and left her a message to contact Dr. Daiva Eves in regards to the PICC.  Dr. Daiva Eves will need to make the decision on whether or not to discontinue the PICC because he prescribed it per Dr. Ardelle Anton.  Dr. Ardelle Anton said Dr. Daiva Eves is okay with observing you off the antibiotics.  He has not received the results or your MRI from over-read.  We will call you when it comes in.

## 2014-01-26 NOTE — Telephone Encounter (Signed)
I contacted Dr. Daiva Eves. He is OK with observing her off of the antibiotics. I have not yet received the results of the over-read from the MRI. She should contact Dr. Daiva Eves about orders to discontinue the PICC if he desires as it was ordered by him. Thanks.

## 2014-01-26 NOTE — Telephone Encounter (Signed)
Patient's IV antibiotics ended on 01/14/14, patient still has picc line in. Per Dr. Daiva Eves picc needs to be removed effective today.

## 2014-01-30 ENCOUNTER — Other Ambulatory Visit: Payer: Self-pay | Admitting: Licensed Clinical Social Worker

## 2014-01-30 ENCOUNTER — Telehealth: Payer: Self-pay | Admitting: Licensed Clinical Social Worker

## 2014-01-30 DIAGNOSIS — A0472 Enterocolitis due to Clostridium difficile, not specified as recurrent: Secondary | ICD-10-CM

## 2014-01-30 MED ORDER — VANCOMYCIN HCL 125 MG PO CAPS: 125.0000 mg | ORAL_CAPSULE | Freq: Four times a day (QID) | ORAL | Status: DC

## 2014-01-30 NOTE — Telephone Encounter (Signed)
Patient called and left message on the voicemail stating that she has fever of 102 this morning, diarrhea with odor, and feels very bad. When I returned her call she was at her PCP's office. I advised her that Dr. Van Daiva Evesam would like to start her back on Vancomycin oral tablets for 14 days. I called those in to her pharmacy. She will let us know if her primary physician advises something different.

## 2014-01-30 NOTE — Telephone Encounter (Signed)
perfect

## 2014-02-02 ENCOUNTER — Telehealth: Payer: Self-pay | Admitting: Podiatry

## 2014-02-02 NOTE — Telephone Encounter (Signed)
I don't know how much it works either but I just threw it out there to here as an option. I thought she could at least go for an evaluation. Thanks.

## 2014-02-02 NOTE — Telephone Encounter (Signed)
I called the patient to discuss the MRI second opinion which did have concerns for continued osteomyelitis. She does state that since her last appointment with me that her toe has been feeling much better. She states of the toe is not been a swollen, discolored or is painful. She does state that yesterday she did wear different shoes to church and she was on her feet all day and her toe is started to throb yesterday and today but she thinks is from the shoes that she was wearing.  In regards to continued osteomyelitis on MRI despite antibiotic treatment I discussed with her various options which included further surgical debridement versus possible hyperbaric oxygen treatment. At this time she would like to try the hyperbaric oxygen therapy. I have placed a referral and to the [facility] for evaluation.  Follow-up with me as scheduled. In the meantime, encouraged to call the office with any questions, concerns, change in symptoms.

## 2014-02-02 NOTE — Telephone Encounter (Signed)
Very good. I dont have any faith in HBO to treat osteomyelitis but perhaps it may help with other issues adjuntively. She also sounds to be suffering from C difficile colitis once again

## 2014-02-03 NOTE — Telephone Encounter (Signed)
No worries, she is a tough case. Hopefully we can get some improvement and she doesn't require more aggressive surgery

## 2014-02-17 ENCOUNTER — Encounter: Payer: Self-pay | Admitting: Infectious Disease

## 2014-02-17 ENCOUNTER — Ambulatory Visit (INDEPENDENT_AMBULATORY_CARE_PROVIDER_SITE_OTHER): Payer: BC Managed Care – PPO | Admitting: Infectious Disease

## 2014-02-17 ENCOUNTER — Other Ambulatory Visit: Payer: Self-pay | Admitting: Infectious Disease

## 2014-02-17 VITALS — BP 128/76 | HR 89 | Temp 98.5°F | Ht 63.0 in | Wt 134.0 lb

## 2014-02-17 DIAGNOSIS — A0472 Enterocolitis due to Clostridium difficile, not specified as recurrent: Secondary | ICD-10-CM

## 2014-02-17 DIAGNOSIS — M869 Osteomyelitis, unspecified: Secondary | ICD-10-CM

## 2014-02-17 DIAGNOSIS — A047 Enterocolitis due to Clostridium difficile: Secondary | ICD-10-CM | POA: Diagnosis not present

## 2014-02-17 DIAGNOSIS — A0471 Enterocolitis due to Clostridium difficile, recurrent: Secondary | ICD-10-CM | POA: Insufficient documentation

## 2014-02-17 LAB — SEDIMENTATION RATE: Sed Rate: 4 mm/hr (ref 0–22)

## 2014-02-17 LAB — C-REACTIVE PROTEIN

## 2014-02-17 LAB — BASIC METABOLIC PANEL WITH GFR
BUN: 10 mg/dL (ref 6–23)
CALCIUM: 9.5 mg/dL (ref 8.4–10.5)
CO2: 29 mEq/L (ref 19–32)
CREATININE: 0.76 mg/dL (ref 0.50–1.10)
Chloride: 106 mEq/L (ref 96–112)
GFR, EST NON AFRICAN AMERICAN: 87 mL/min
GFR, Est African American: >89 mL/min
Glucose, Bld: 94 mg/dL (ref 70–99)
POTASSIUM: 4 meq/L (ref 3.5–5.3)
SODIUM: 142 meq/L (ref 135–145)

## 2014-02-17 NOTE — Progress Notes (Signed)
OK, thanks for your help 

## 2014-02-17 NOTE — Progress Notes (Signed)
Subjective:    Patient ID: Carrie [name], female    DOB: 1955-06-29, 59 y.o.   MRN: [id number]  HPI   59 year old with toe injury (not sure mechanism thinks it was due to carpet tack maybe?). Was sore on outside. And underneath, toenail swollen. Turned red. Pt cut nail, went back, went to Dr. Gretta Arab on August 17th.  He cut out toenail and put pt on abx , took for 7 days. Infection was worse. He removed nail more. Pt started bactrim DS BID. Pt went in and was not much improved. Pt "messed with scab pus and blood came out. Called Tillis back. Bactrim DS bid again which helped but recurred again off of the bactrim. Went on the 21st of September. 2 days later much worse. Pt then went to see Dr. Jacqualyn Posey. Dr. Jacqualyn Posey saw her did plain films suspected osteomyelitis and started cipro and clindamycin.  Pt had MRI in October which showed bone Marrow edema throughout the distal phalanx of the great toe is consistent with osteomyelitis Pt was off the clinda and cipro for nearly 58 weeks. Had surgery on Monday. Back on cipro and clindamycin.   Cultures yielded no organisms and pathology was read as NOT being consistent with osteomyelitis but we felt we should treat for osteomyelitis nonetheless. After being on cipro and clinda for further protracted time period ultimately she agreed to IV abx and she started on vancomycin and ceftriaxone on 12/06/13. After having been on the antibiotics she developed fever and was admitted to the hospital and where initial workup was completely negative. Ultimately she began having profuse diarrhea and C difficile PCR positive, was treated with flagyl but did not tolerate well due to nausea then to oral vancomycin which she finished.  I saw her in December and at that time she had some more pain  than it did several weeks ago though she is walking on it at work.   She has been followed closely by Dr. Jacqualyn Posey who obtained a repeat MRI which was initially read as not showing  osteomyelitis but 2nd opinion was that it did show osteomyelitis of distal phalanze of great toe.  She has remained off antibiotics since then. She had fevers, nausea and diarrhea that she thought was a "stomach virus." but then had copious loose stools and was dx with first recurrence of CDI and given vancomycin orally which she finished.  The toe largely ONLY hurts when she bears weight rather than at rest and pain has NOT worsened since I last saw her.   Review of Systems  Constitutional: Positive for fatigue. Negative for fever, chills, activity change and appetite change.  HENT: Negative for hearing loss, sinus pressure and sore throat.   Eyes: Negative for photophobia and visual disturbance.  Respiratory: Negative for choking, chest tightness, shortness of breath and wheezing.   Gastrointestinal: Negative for abdominal pain, constipation and abdominal distention.  Endocrine: Negative for cold intolerance.  Genitourinary: Negative for dysuria, flank pain and difficulty urinating.  Musculoskeletal: Negative for back pain and arthralgias.  Skin: Positive for color change and wound.  Neurological: Negative for dizziness and headaches.  Hematological: Negative for adenopathy. Does not bruise/bleed easily.  Psychiatric/Behavioral: Negative for behavioral problems, confusion, dysphoric mood, decreased concentration and agitation.       Objective:   Physical Exam  Constitutional: She is oriented to person, place, and time. She appears well-developed and well-nourished. No distress.  HENT:  Head: Normocephalic and atraumatic.  Mouth/Throat: No oropharyngeal exudate.  Eyes: Conjunctivae and EOM are normal. No scleral icterus.  Neck: Normal range of motion. Neck supple.  Cardiovascular: Normal rate and regular rhythm.   Pulmonary/Chest: Effort normal. No respiratory distress. She has no wheezes.  Abdominal: She exhibits no distension.  Musculoskeletal: She exhibits no edema.    Neurological: She is alert and oriented to person, place, and time. She exhibits normal muscle tone. Coordination normal.  Skin: Skin is warm and dry. She is not diaphoretic.  Psychiatric: She has a normal mood and affect. Her behavior is normal. Judgment and thought content normal.      Toe today 01/05/14 healed up site from biopsy, not especially erythematous       02/17/14:  Repeat pictures from today            Assessment & Plan:  Osteomyelitis of toe: While the MRI was read with 2nd opinion to show osteomyelitis we do not know if these changes may instead be chronic changes rather than reflect evidence of an ongoing severe progressive infection. Clinically I find very Carrie to suggest this  I will recheck ESR, CRP today and continue to observe her off systemic antibiotics  I did warn her that there is not any convincing evidence that hyperbaric oxygen is of any evidence to the therapy of osteomyelitis and I doubt this therapy will be worthwhile to her given cost but she will decide further    I spent greater than 40 minutes with the patient including greater than 50% of time in face to face counsel of the patient and in coordination of their care.   Clostridium difficile: avoid Clindamycin, cipro, levaquin AT ALL COSTS in future. She has already had one relapse, all the more reason to observe her and NOT pull trigger on repeat antibiotics for her foot.

## 2014-04-16 ENCOUNTER — Ambulatory Visit: Payer: BC Managed Care – PPO | Admitting: Infectious Disease

## 2014-04-21 ENCOUNTER — Ambulatory Visit (INDEPENDENT_AMBULATORY_CARE_PROVIDER_SITE_OTHER): Payer: BC Managed Care – PPO | Admitting: Infectious Disease

## 2014-04-21 ENCOUNTER — Encounter: Payer: Self-pay | Admitting: Infectious Disease

## 2014-04-21 VITALS — BP 103/71 | HR 69 | Wt 133.0 lb

## 2014-04-21 DIAGNOSIS — M86172 Other acute osteomyelitis, left ankle and foot: Secondary | ICD-10-CM

## 2014-04-21 DIAGNOSIS — A0472 Enterocolitis due to Clostridium difficile, not specified as recurrent: Secondary | ICD-10-CM

## 2014-04-21 DIAGNOSIS — A047 Enterocolitis due to Clostridium difficile: Secondary | ICD-10-CM

## 2014-04-21 NOTE — Progress Notes (Signed)
Subjective:    Patient ID: Carrie [name], female    DOB: 1955-02-20, 59 y.o.   MRN: [id number]  HPI   59 year old with toe injury (not sure mechanism thinks it was due to carpet tack maybe?). Was sore on outside. And underneath, toenail swollen. Turned red. Pt cut nail, went back, went to Dr. Gretta Arab on August 17th.  He cut out toenail and put pt on abx , took for 7 days. Infection was worse. He removed nail more. Pt started bactrim DS BID. Pt went in and was not much improved. Pt "messed with scab pus and blood came out. Called Tillis back. Bactrim DS bid again which helped but recurred again off of the bactrim. Went on the 21st of September. 2 days later much worse. Pt then went to see Dr. Jacqualyn Posey. Dr. Jacqualyn Posey saw her did plain films suspected osteomyelitis and started cipro and clindamycin.  Pt had MRI in October which showed bone Marrow edema throughout the distal phalanx of the great toe is consistent with osteomyelitis Pt was off the clinda and cipro for nearly 2 weeks. Had surgery on Monday. Back on cipro and clindamycin.   Cultures yielded no organisms and pathology was read as NOT being consistent with osteomyelitis but we felt we should treat for osteomyelitis nonetheless. After being on cipro and clinda for further protracted time period ultimately she agreed to IV abx and she started on vancomycin and ceftriaxone on 12/06/13. After having been on the antibiotics she developed fever and was admitted to the hospital and where initial workup was completely negative. Ultimately she began having profuse diarrhea and C difficile PCR positive, was treated with flagyl but did not tolerate well due to nausea then to oral vancomycin which she finished.  I saw her in December 2015 and at that time she had some more pain  than it did several weeks ago though she is walking on it at work.   She has been followed closely by Dr. Jacqualyn Posey who obtained a repeat MRI which was initially read as not showing  osteomyelitis but 2nd opinion was that it did show osteomyelitis of distal phalanze of great toe.  She has remained off antibiotics since then. She had fevers, nausea and diarrhea that she thought was a "stomach virus." but then had copious loose stools and was dx with first recurrence of CDI and given vancomycin orally which she finished.  I saw her in January and patient with minimal toe pain, largely ONLY hurts when she bears weight rather than at rest and pain has NOT worsened since I last saw her.  ESR and CRP were completely normal in January.  Now more than 3 months later toe is feeling much better   Review of Systems  Constitutional: Positive for fatigue. Negative for fever, chills, activity change and appetite change.  HENT: Negative for hearing loss, sinus pressure and sore throat.   Eyes: Negative for photophobia and visual disturbance.  Respiratory: Negative for choking, chest tightness, shortness of breath and wheezing.   Gastrointestinal: Negative for abdominal pain, constipation and abdominal distention.  Endocrine: Negative for cold intolerance.  Genitourinary: Negative for dysuria, flank pain and difficulty urinating.  Musculoskeletal: Negative for back pain and arthralgias.  Skin: Positive for color change and wound.  Neurological: Negative for dizziness and headaches.  Hematological: Negative for adenopathy. Does not bruise/bleed easily.  Psychiatric/Behavioral: Negative for behavioral problems, confusion, dysphoric mood, decreased concentration and agitation.       Objective:  Physical Exam  Constitutional: She is oriented to person, place, and time. She appears well-developed and well-nourished. No distress.  HENT:  Head: Normocephalic and atraumatic.  Mouth/Throat: No oropharyngeal exudate.  Eyes: Conjunctivae and EOM are normal. No scleral icterus.  Neck: Normal range of motion. Neck supple.  Cardiovascular: Normal rate and regular rhythm.     Pulmonary/Chest: Effort normal. No respiratory distress. She has no wheezes.  Abdominal: She exhibits no distension.  Musculoskeletal: She exhibits no edema.  Neurological: She is alert and oriented to person, place, and time. She exhibits normal muscle tone. Coordination normal.  Skin: Skin is warm and dry. She is not diaphoretic.  Psychiatric: She has a normal mood and affect. Her behavior is normal. Judgment and thought content normal.      Toe 01/05/14 healed up site from biopsy, not especially erythematous       02/17/14:  Repeat pictures       Pictures from today 04/22/14:            Assessment & Plan:  Osteomyelitis of toe: While the MRI was read with 2nd opinion to show osteomyelitis we do not know if these changes may instead be chronic changes rather than reflect evidence of an ongoing severe progressive infection. Clinically I find very little to suggest this and ESR, CRP normal and pt doing fine OFF abx for more than 4 months  Fu prn   Clostridium difficile: avoid Clindamycin, cipro, levaquin AT ALL COSTS in future. She has potentially had one relapse, all the more reason to observe her and NOT pull trigger on repeat antibiotics for her foot.

## 2016-07-24 IMAGING — CR DG CHEST 2V
2 series · 2 of 2 positions shown · non-contrast
Comparison: Chest fluoroscopy 12/03/2013

CLINICAL DATA: Fever, nausea and vomiting, no chest complaints. To
check PICC line.

EXAM:
CHEST  2 VIEW

[w chest pa]
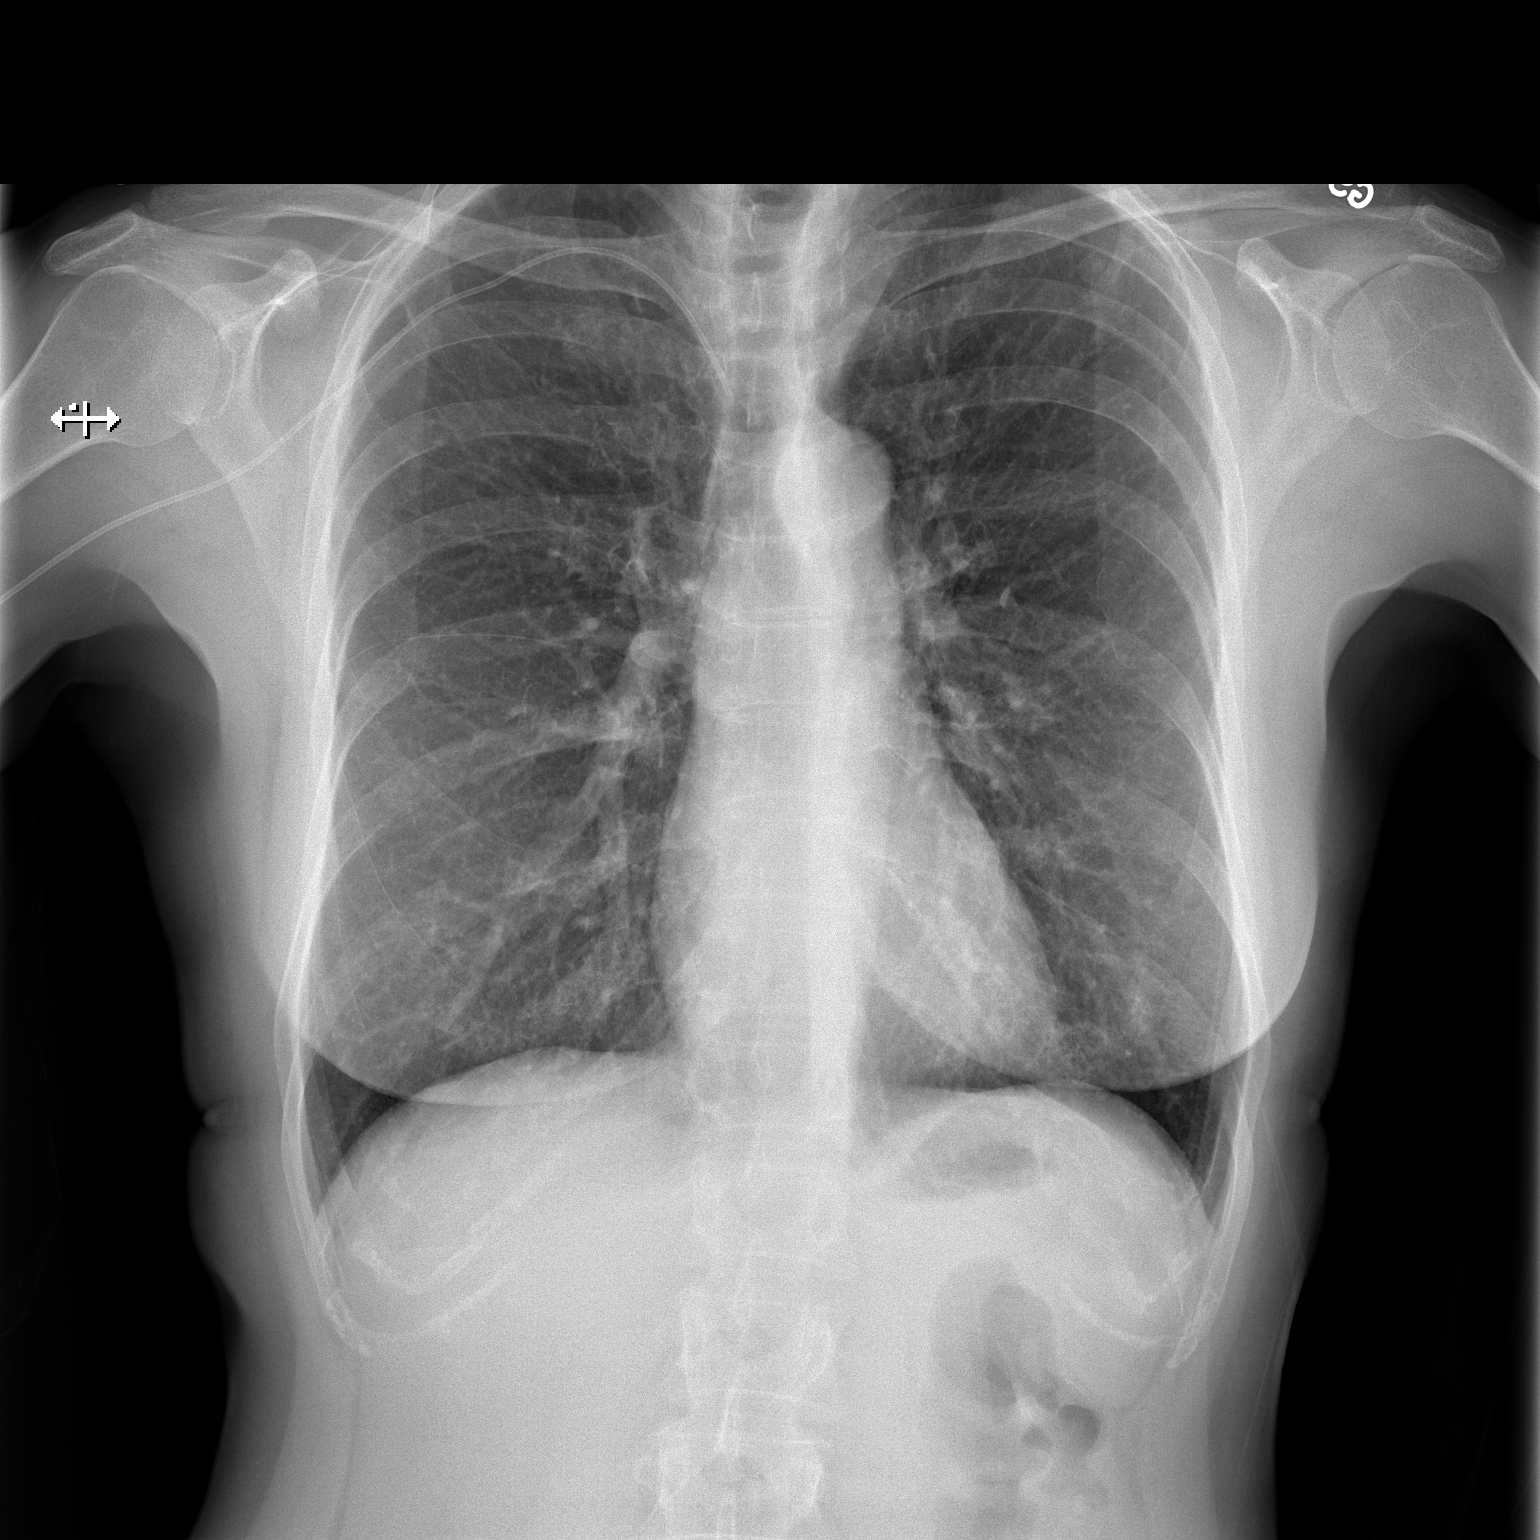

[w chest lat]
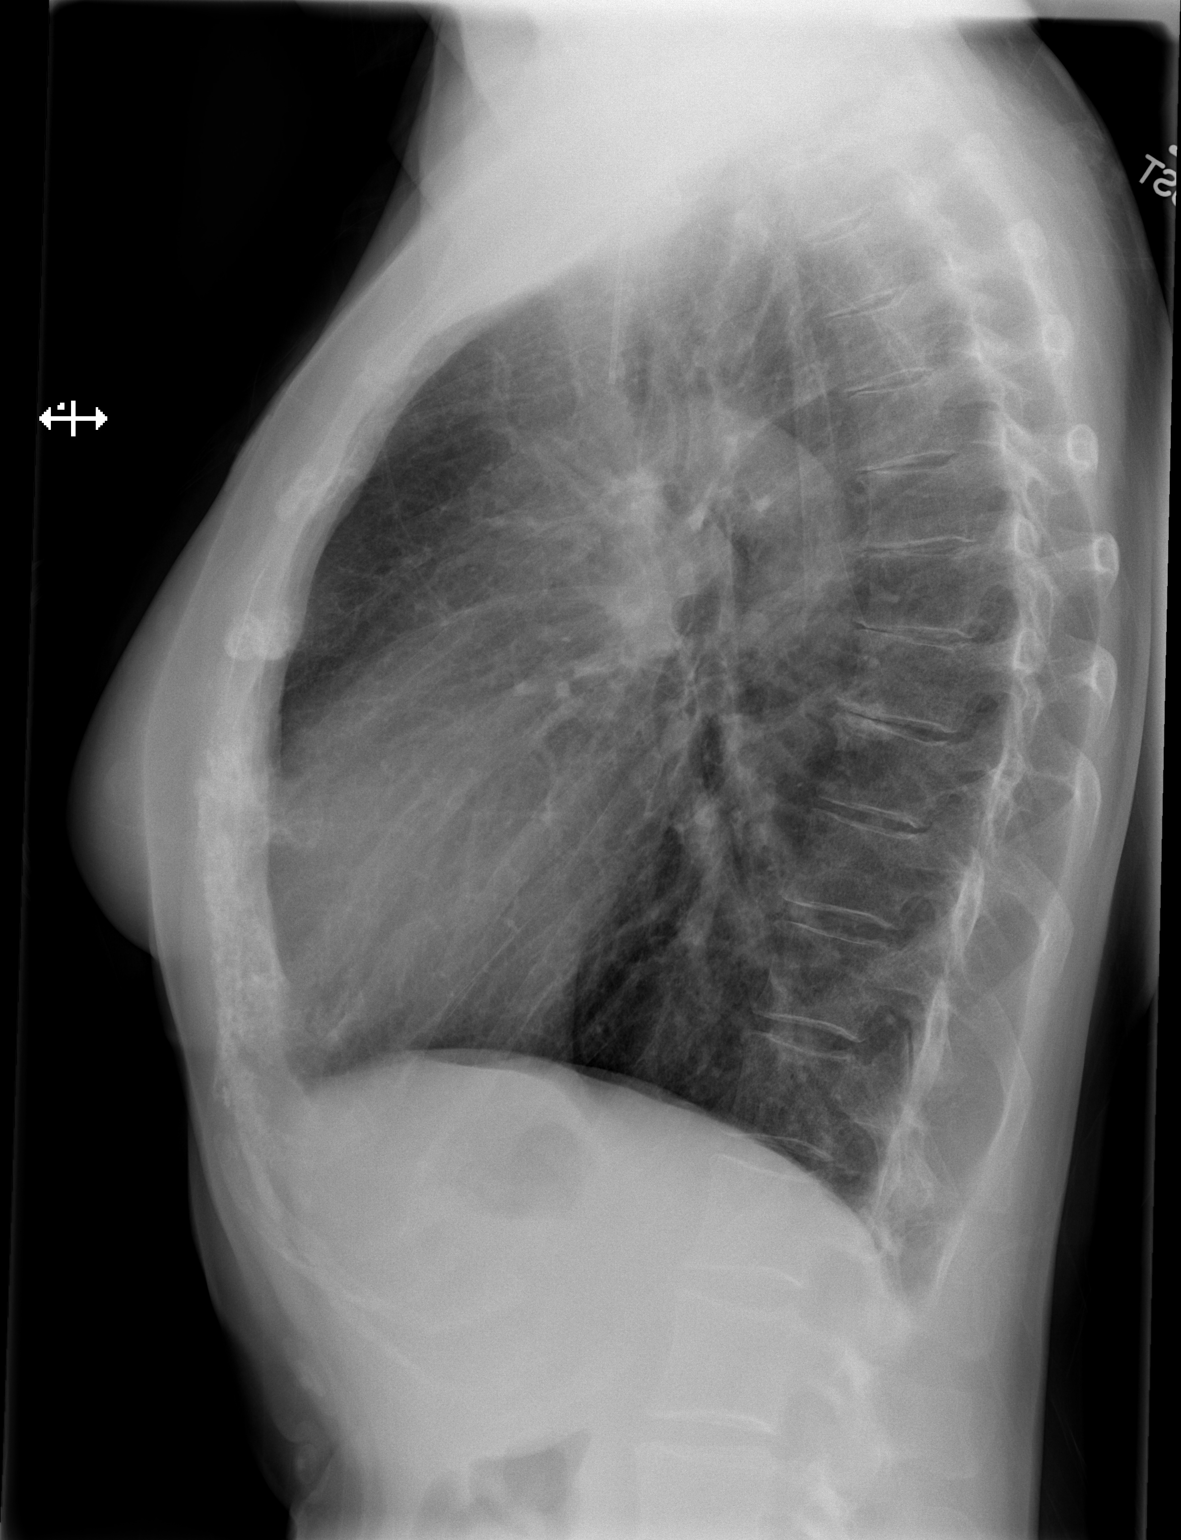

[2 of 2 positions shown; findings below may reference images not displayed]

FINDINGS: Right PICC line with tip projected over the upper SVC region. No
pneumothorax. Mild hyperinflation. Normal heart size and pulmonary
vascularity. No focal airspace disease or consolidation in the
lungs. No blunting of costophrenic angles. Mediastinal contours
appear intact.
IMPRESSION: PICC line tip projects over the upper SVC. No pneumothorax. Lungs
clear. Mild hyperinflation.

## 2016-07-24 IMAGING — CR DG FOOT COMPLETE 3+V*R*
3 series · 3 of 3 positions shown · non-contrast
Comparison: 11/14/2013

CLINICAL DATA: Wound in the first toe.  Pain.  Fever.  Infection.

EXAM:
RIGHT FOOT COMPLETE - 3+ VIEW

[x foot ap right]
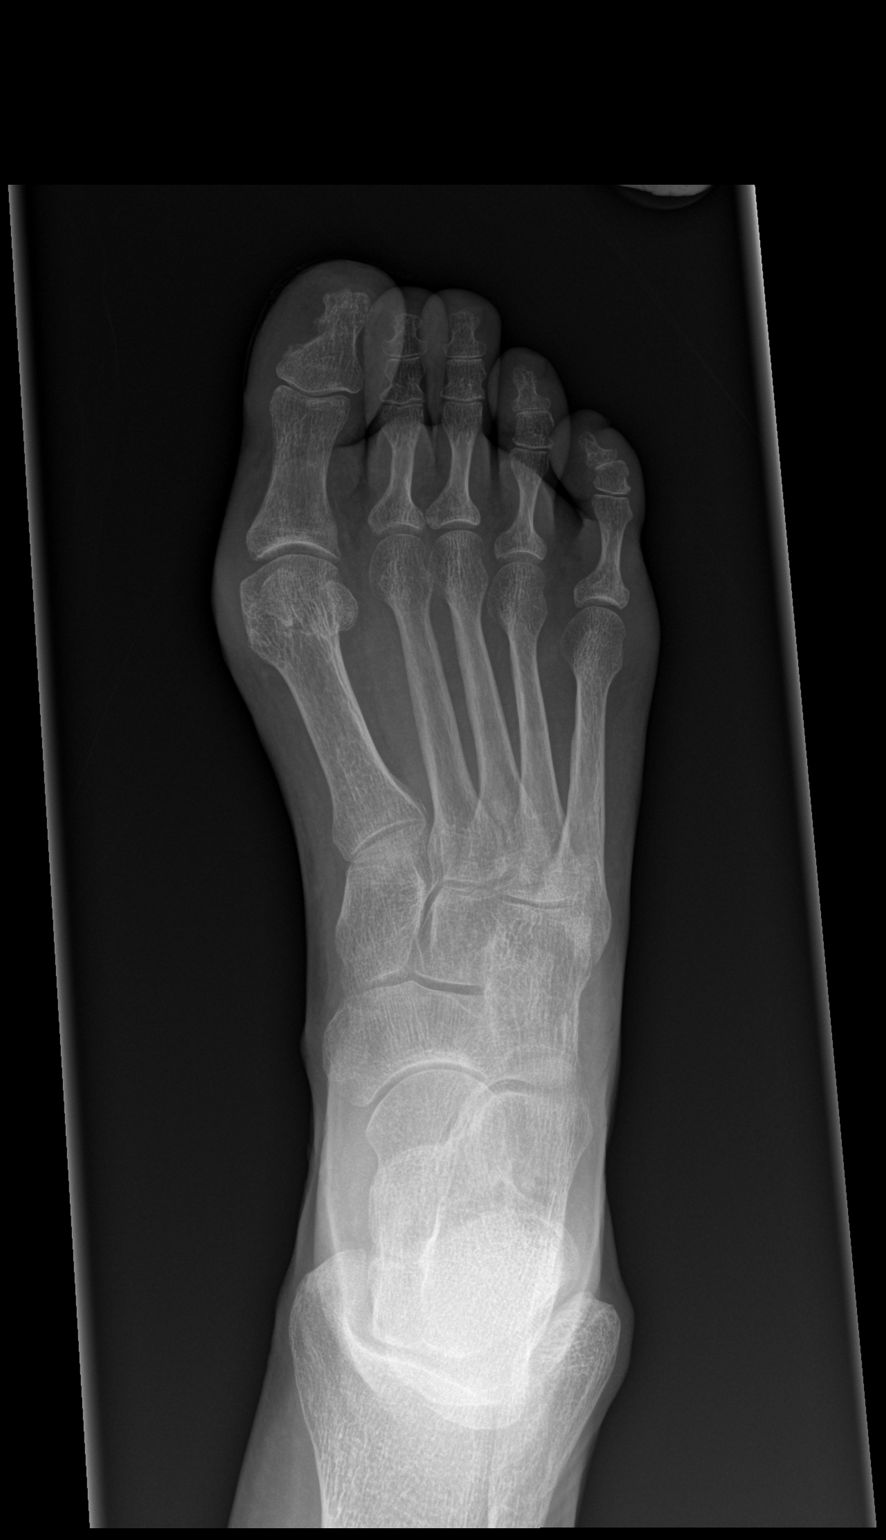

[x foot obl right]
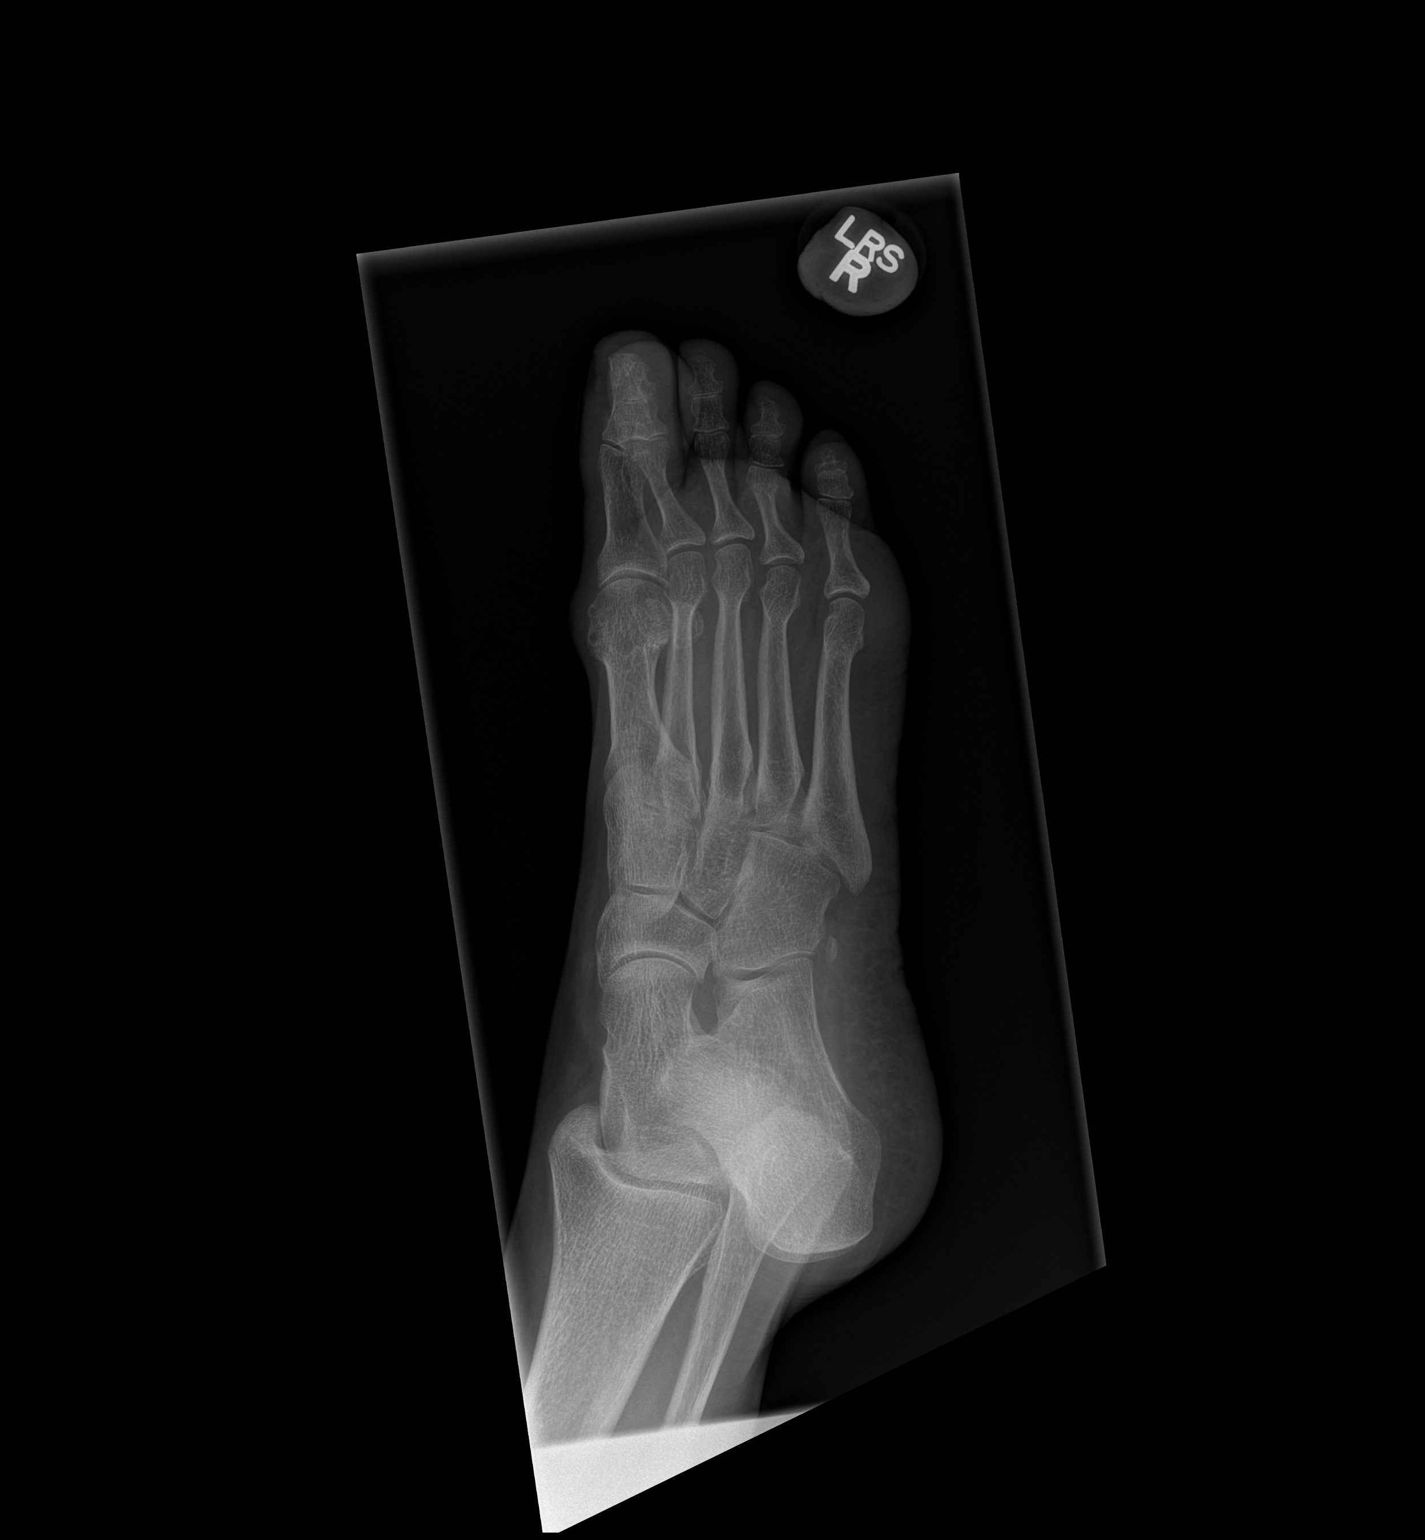

[x foot lat right]
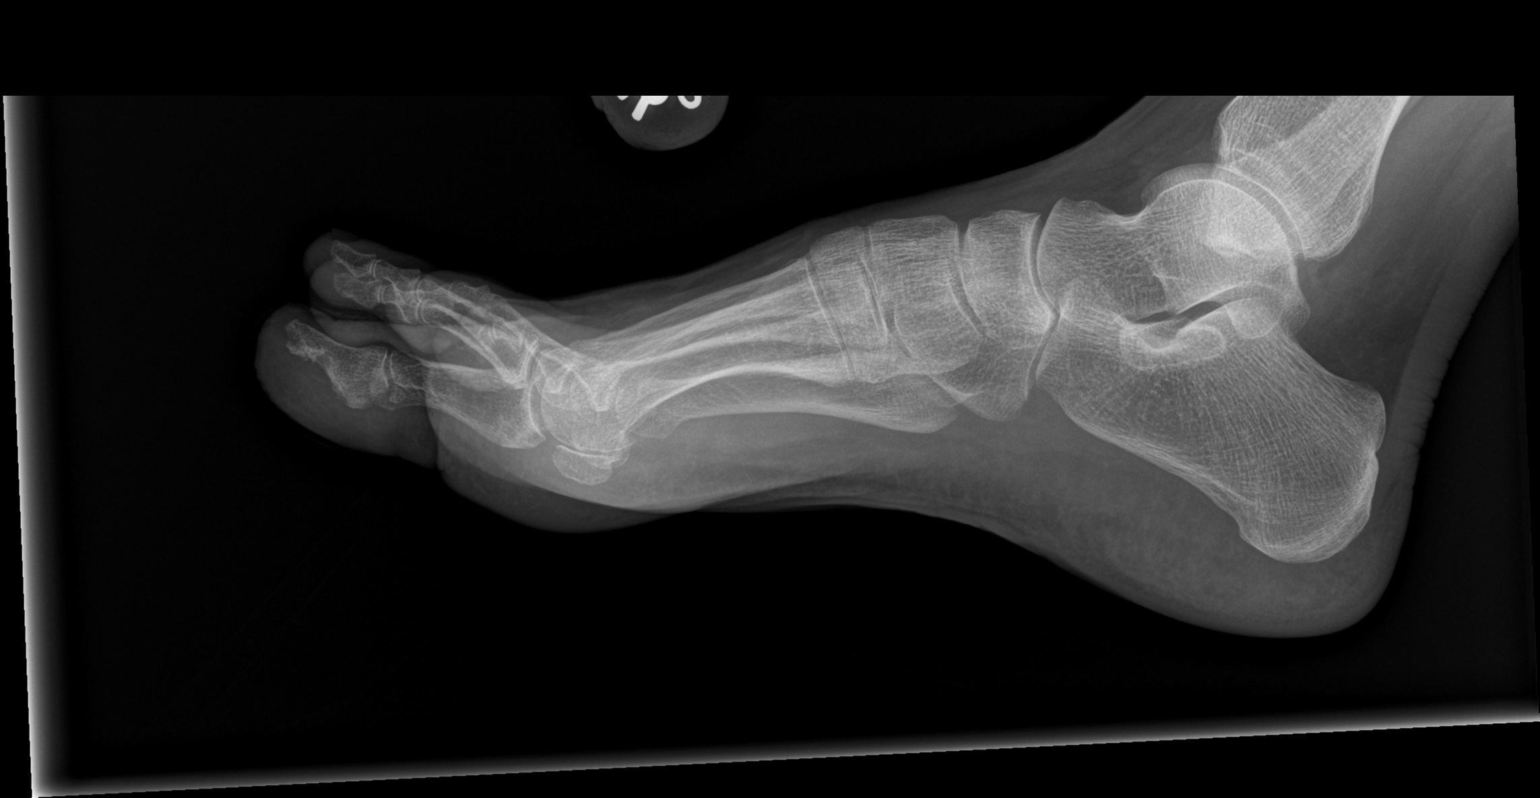

[3 of 3 positions shown; findings below may reference images not displayed]

FINDINGS: Mild hallux valgus deformity. No evidence of acute fracture or
dislocation of the right foot. No focal bone lesion or bone
destruction. Bone cortex appears intact. No cortical erosion or
sclerosis to suggest osteomyelitis. Soft tissues are unremarkable.
IMPRESSION: Mild hallux valgus deformity. No acute bony abnormalities. No
changes to suggest osteomyelitis.

## 2016-12-04 ENCOUNTER — Ambulatory Visit: Payer: BC Managed Care – PPO | Admitting: Podiatry

## 2016-12-04 ENCOUNTER — Encounter: Payer: Self-pay | Admitting: Podiatry

## 2016-12-04 DIAGNOSIS — M779 Enthesopathy, unspecified: Secondary | ICD-10-CM

## 2016-12-04 DIAGNOSIS — M722 Plantar fascial fibromatosis: Secondary | ICD-10-CM

## 2016-12-04 DIAGNOSIS — M7742 Metatarsalgia, left foot: Secondary | ICD-10-CM | POA: Diagnosis not present

## 2016-12-04 NOTE — Patient Instructions (Signed)

## 2016-12-05 NOTE — Addendum Note (Signed)
Addended by: Ovid CurdWAGONER, MATTHEW R on: 12/05/2016 07:34 AM   Modules accepted: Level of Service

## 2016-12-05 NOTE — Progress Notes (Addendum)
  Subjective: Carrie [name] presents the office today for concerns of intermittent discomfort in the ball of her foot.  She states this is been ongoing for quite some time and she states it only happens when she runs or if she is doing exercises on her toes.  She states that she almost canceled his appointment as her symptoms are minimal and have been getting better but she does still experiencing discomfort.  She has no pain with regular activity or walking.  She has not noticed any swelling or redness.  She also states that she is starting to get some arch pain which started after she was doing [place] butt lift workout.  She does have a history of plantar fasciitis in the other foot.  She denies any recent injury no recent treatment Denies any systemic complaints such as fevers, chills, nausea, vomiting. No acute changes since last appointment, and no other complaints at this time.   Objective: AAO x3, NAD DP/PT pulses palpable bilaterally, CRT less than 3 seconds There is minimal discomfort on submetatarsal 2 and 3 in the left foot but there is no area of pinpoint bony tenderness or pain to vibratory sensation.  There is no overlying edema, erythema, increase in warmth.  There is no palpable neuroma identified.  There is minimal discomfort on the medial band of plantar fascia in the arch of the foot.  Plantar fascia appears to be intact.  Achilles tendon appears to be intact.  No other areas of tenderness. No open lesions or pre-ulcerative lesions.  No pain with calf compression, swelling, warmth, erythema  Assessment: Capsulitis/metatarsalgia, plantar fasciitis left foot  Plan: -All treatment options discussed with the patient including all alternatives, risks, complications.  -Discussed a steroid injection but she declines. -We discussed changing shoes.  We also discussed inserts for shoes.  Dispensed metatarsal pads to help offload the area as well.  We will work on some stretching, rehab  exercises as well.  She wishes to modify any anti-inflammatories at this time as well.  She was given meloxicam and she did stop the medication. -Patient encouraged to call the office with any questions, concerns, change in symptoms.   Vivi BarrackMatthew R Delanee Xin DPM   (The note that was previously dictated to the patient.  Charges were also removed.)

## 2017-06-27 ENCOUNTER — Ambulatory Visit (INDEPENDENT_AMBULATORY_CARE_PROVIDER_SITE_OTHER): Payer: BC Managed Care – PPO | Admitting: Orthopedic Surgery

## 2017-06-27 ENCOUNTER — Ambulatory Visit (INDEPENDENT_AMBULATORY_CARE_PROVIDER_SITE_OTHER): Payer: BC Managed Care – PPO

## 2017-06-27 ENCOUNTER — Encounter (INDEPENDENT_AMBULATORY_CARE_PROVIDER_SITE_OTHER): Payer: Self-pay | Admitting: Orthopedic Surgery

## 2017-06-27 DIAGNOSIS — G8929 Other chronic pain: Secondary | ICD-10-CM

## 2017-06-27 DIAGNOSIS — M25511 Pain in right shoulder: Secondary | ICD-10-CM | POA: Diagnosis not present

## 2017-07-01 ENCOUNTER — Encounter (INDEPENDENT_AMBULATORY_CARE_PROVIDER_SITE_OTHER): Payer: Self-pay | Admitting: Orthopedic Surgery

## 2017-07-01 NOTE — Progress Notes (Signed)
Office Visit Note   Patient: Carrie [name]           Date of Birth: 08/05/1955           MRN: [id number] Visit Date: 06/27/2017 Requested by: Hadley Pen, MD [address] [place], [place] [address] PCP: Hadley Pen, MD  Subjective: Chief Complaint  Patient presents with  . Right Shoulder - Pain    HPI: Patient presents with right shoulder pain of 4 years duration.  Denies any history of injury.  He teaches Scientist, physiological.  States that the pain is getting worse.  She is right-hand dominant.  The pain will wake her from sleep at night.  She reports a painful range of motion.  3 summers ago she was doing some sanding when it really started.  2 summers ago she developed night pain.  She denies any weakness or loss of motion.  She reports some spasm type pain in her trapezial region.  Advil and Aleve do help her symptoms.  The pain does radiate down to the biceps region.              ROS: All systems reviewed are negative as they relate to the chief complaint within the history of present illness.  Patient denies  fevers or chills.   Assessment & Plan: Visit Diagnoses:  1. Right shoulder pain, unspecified chronicity   2. Chronic right shoulder pain     Plan: Impression is right shoulder pain with no real loss of motion or strength.  This could represent a degenerative SLAP tear.  She does have a history of spontaneous osteomyelitis in the toe which has resolved.  She has had a long history of symptoms and is failed conservative management.  She is having some night pain.  Needs MRI right shoulder arthrogram to evaluate for SLAP tear.  I will see her back after that study  Follow-Up Instructions: Return for after MRI.   Orders:  Orders Placed This Encounter  Procedures  . XR Shoulder Right  . MR SHOULDER RIGHT W CONTRAST  . Arthrogram   No orders of the defined types were placed in this encounter.     Procedures: No procedures performed   Clinical Data: No  additional findings.  Objective: Vital Signs: There were no vitals taken for this visit.  Physical Exam:   Constitutional: Patient appears well-developed HEENT:  Head: Normocephalic Eyes:EOM are normal Neck: Normal range of motion Cardiovascular: Normal rate Pulmonary/chest: Effort normal Neurologic: Patient is alert Skin: Skin is warm Psychiatric: Patient has normal mood and affect   Ortho Exam: Ortho exam demonstrates full active and passive range of motion of both shoulders.  Rotator cuff strength is intact on the right infraspinatus supraspinatus and subscap muscle testing.  Equivocal impingement signs on the right negative on the left.  Positive O'Brien's testing on the right negative on the left.  Negative apprehension relocation testing on the right compared to the left.  No pain with crossarm adduction on the right.  No discrete AC joint tenderness on the right-hand side.  No other masses lymphadenopathy or skin changes noted in the shoulder girdle region.  Neck range of motion is full.  Motor or sensory function to the right arm is intact.  Specialty Comments:  No specialty comments available.  Imaging: No results found.   PMFS History: Patient Active Problem List   Diagnosis Date Noted  . 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 Enteritis due to Clostridium  difficile   . Osteomyelitis (HCC) 12/10/2013  . Fever 12/10/2013  . Sepsis (HCC) 12/10/2013  . Obstructive sleep apnea 12/10/2013  . Acute osteomyelitis of ankle and foot (HCC) 11/06/2013   Past Medical History:  Diagnosis Date  . Clostridium difficile diarrhea   . Fibromyalgia   . OSA on CPAP   . Osteomyelitis of toe (HCC)     History reviewed. No pertinent family history.  Past Surgical History:  Procedure Laterality Date  . biopsy of toe Right   . BLADDER SUSPENSION    . PERIPHERALLY INSERTED CENTRAL CATHETER INSERTION  11/2013  . TEMPOROMANDIBULAR JOINT ARTHROPLASTY Bilateral   . TOE  DEBRIDEMENT Right   . VAGINAL HYSTERECTOMY     Social History   Occupational History  . Not on file  Tobacco Use  . Smoking status: Never Smoker  . Smokeless tobacco: Never Used  Substance and Sexual Activity  . Alcohol use: No  . Drug use: No  . Sexual activity: Never

## 2017-07-12 ENCOUNTER — Ambulatory Visit
Admission: RE | Admit: 2017-07-12 | Discharge: 2017-07-12 | Disposition: A | Discharge status: Home / Self Care | Payer: BC Managed Care – PPO | Source: Ambulatory Visit | Attending: Orthopedic Surgery | Admitting: Orthopedic Surgery

## 2017-07-12 DIAGNOSIS — M25511 Pain in right shoulder: Principal | ICD-10-CM

## 2017-07-12 DIAGNOSIS — G8929 Other chronic pain: Secondary | ICD-10-CM

## 2017-07-12 MED ORDER — IOPAMIDOL (ISOVUE-M 200) INJECTION 41%: 15.0000 mL | Freq: Once | INTRAMUSCULAR | Status: DC

## 2017-07-13 ENCOUNTER — Ambulatory Visit (INDEPENDENT_AMBULATORY_CARE_PROVIDER_SITE_OTHER): Payer: BC Managed Care – PPO | Admitting: Orthopedic Surgery

## 2017-07-13 ENCOUNTER — Encounter (INDEPENDENT_AMBULATORY_CARE_PROVIDER_SITE_OTHER): Payer: Self-pay | Admitting: Orthopedic Surgery

## 2017-07-13 DIAGNOSIS — M75121 Complete rotator cuff tear or rupture of right shoulder, not specified as traumatic: Secondary | ICD-10-CM | POA: Diagnosis not present

## 2017-07-14 ENCOUNTER — Encounter (INDEPENDENT_AMBULATORY_CARE_PROVIDER_SITE_OTHER): Payer: Self-pay | Admitting: Orthopedic Surgery

## 2017-07-14 MED ORDER — METHYLPREDNISOLONE ACETATE 40 MG/ML IJ SUSP
40.0000 mg | INTRAMUSCULAR | Status: AC | PRN
  Administered 2017-07-14: 40 mg via INTRA_ARTICULAR

## 2017-07-14 MED ORDER — LIDOCAINE HCL 1 % IJ SOLN
5.0000 mL | INTRAMUSCULAR | Status: AC | PRN
  Administered 2017-07-14: 5 mL

## 2017-07-14 MED ORDER — BUPIVACAINE HCL 0.5 % IJ SOLN
9.0000 mL | INTRAMUSCULAR | Status: AC | PRN
  Administered 2017-07-14: 9 mL via INTRA_ARTICULAR

## 2017-07-14 NOTE — Progress Notes (Signed)
Office Visit Note   Patient: Carrie [name]           Date of Birth: December 17, 1955           MRN: [id number] Visit Date: 07/13/2017 Requested by: Hadley Pen, MD [address] [place], [place] [address] PCP: Hadley Pen, MD  Subjective: Chief Complaint  Patient presents with  . Right Shoulder - Follow-up    HPI: Carrie [name] is a patient with right shoulder pain.  She rates the pain as 5 or 6 out of 10.  Since I have seen her she has had an MRI scan of the right shoulder which shows biceps gnosis as well as a very small full-thickness tear of the supraspinatus tendon.  She also has AC joint arthritis with some edema in the distal clavicle.  She states her shoulder symptoms are getting worse.  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  She is reporting shoulder blade symptoms which also radiate down her back.  She has no relief by placing her hand in the overhead position.  She denies much in the way of radicular symptoms extending below the elbow.              ROS: All systems reviewed are negative as they relate to the chief complaint within the history of present illness.  Patient denies  fevers or chills.   Assessment & Plan: Visit Diagnoses:  1. 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  We will try a diagnostic and therapeutic injection into the subacromial space today.  She is having some shoulder blade pain which I do not think is necessarily related to her shoulder pathology.  That could be referred pain from the neck.  We will see her back in 8 weeks to reassess whether or not she needs surgical intervention for shoulder pathology or neck work-up for possible cervical spine pathology.  Follow-Up Instructions: Return in about 8 weeks (around 09/07/2017).    Orders:  No orders of the defined types were placed in this encounter.  No orders of the defined types were placed in this encounter.     Procedures: Large Joint Inj: R subacromial bursa on 07/14/2017 8:02 AM Indications: diagnostic evaluation and pain Details: 18 G 1.5 in needle, posterior approach  Arthrogram: No  Medications: 9 mL bupivacaine 0.5 %; 40 mg methylPREDNISolone acetate 40 MG/ML; 5 mL lidocaine 1 % Outcome: tolerated well, no immediate complications Procedure, treatment alternatives, risks and benefits explained, specific risks discussed. Consent was given by the patient. Immediately prior to procedure a time out was called to verify the correct patient, procedure, equipment, support staff and site/side marked as required. Patient was prepped and draped in the usual sterile fashion.       Clinical Data: No additional findings.  Objective: Vital Signs: There were no vitals taken for this visit.  Physical Exam:   Constitutional: Patient appears well-developed HEENT:  Head: Normocephalic Eyes:EOM are normal Neck: Normal range of motion Cardiovascular: Normal rate Pulmonary/chest: Effort normal Neurologic: Patient is alert Skin: Skin is warm Psychiatric: Patient has normal mood and affect    Ortho Exam: Ortho exam demonstrates full active and passive range of motion of her cervical spine.  No paresthesias C5-T1.  Got decent passive range of motion and strength in  the right shoulder versus left.  She is more tender on the [facility] right versus left.  O'Brien's testing equivocal on the right negative on the left.  Impingement signs positive on the right negative on the left.  Not much in the way of coarse grinding or crepitus with active and passive range of motion of the shoulder at 90 degrees of abduction.  Specialty Comments:  No specialty comments available.  Imaging: No results found.   PMFS History: Patient Active Problem List   Diagnosis Date  Noted  . 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 Enteritis due to Clostridium difficile   . Osteomyelitis (HCC) 12/10/2013  . Fever 12/10/2013  . Sepsis (HCC) 12/10/2013  . Obstructive sleep apnea 12/10/2013  . Acute osteomyelitis of ankle and foot (HCC) 11/06/2013   Past Medical History:  Diagnosis Date  . Clostridium difficile diarrhea   . Fibromyalgia   . OSA on CPAP   . Osteomyelitis of toe (HCC)     History reviewed. No pertinent family history.  Past Surgical History:  Procedure Laterality Date  . biopsy of toe Right   . BLADDER SUSPENSION    . PERIPHERALLY INSERTED CENTRAL CATHETER INSERTION  11/2013  . TEMPOROMANDIBULAR JOINT ARTHROPLASTY Bilateral   . TOE DEBRIDEMENT Right   . VAGINAL HYSTERECTOMY     Social History   Occupational History  . Not on file  Tobacco Use  . Smoking status: Never Smoker  . Smokeless tobacco: Never Used  Substance and Sexual Activity  . Alcohol use: No  . Drug use: No  . Sexual activity: Never

## 2017-09-07 ENCOUNTER — Ambulatory Visit (INDEPENDENT_AMBULATORY_CARE_PROVIDER_SITE_OTHER): Payer: BC Managed Care – PPO | Admitting: Orthopedic Surgery

## 2017-09-12 ENCOUNTER — Ambulatory Visit (INDEPENDENT_AMBULATORY_CARE_PROVIDER_SITE_OTHER): Payer: BC Managed Care – PPO | Admitting: Orthopedic Surgery

## 2017-10-24 ENCOUNTER — Ambulatory Visit (INDEPENDENT_AMBULATORY_CARE_PROVIDER_SITE_OTHER): Payer: BC Managed Care – PPO | Admitting: Orthopedic Surgery

## 2017-10-24 ENCOUNTER — Encounter (INDEPENDENT_AMBULATORY_CARE_PROVIDER_SITE_OTHER): Payer: Self-pay | Admitting: Orthopedic Surgery

## 2017-10-24 DIAGNOSIS — M75121 Complete rotator cuff tear or rupture of right shoulder, not specified as traumatic: Secondary | ICD-10-CM | POA: Diagnosis not present

## 2017-10-24 NOTE — Progress Notes (Signed)
Office Visit Note   Patient: Carrie [name]           Date of Birth: [birth year]           MRN: [id number] Visit Date: 10/24/2017 Requested by: Hadley Pen, MD [address] [place], [place] [address] PCP: Hadley Pen, MD  Subjective: Chief Complaint  Patient presents with  . Follow-up    right arm pain    HPI: Patient presents for follow-up of right shoulder pain.  She has by MRI scan known small non-retracted rotator cuff tear along with [facility] joint arthritis and biceps tendinitis.  She got very good relief from the injection on 07/13/2017.  She is been going to the gym since then working on arm shoulders as well as aerobic exercise.  She reports some neck symptoms but no radicular symptoms.  Her shoulder in general is much improved compared to where it was in June.  She denies any radicular symptoms.  No shoulder blade symptoms.  She has been taking anti-inflammatories.  She really only notices neck issues with rotation.              ROS: All systems reviewed are negative as they relate to the chief complaint within the history of present illness.  Patient denies  fevers or chills.   Assessment & Plan: Visit Diagnoses: No diagnosis found.  Plan: Impression is right shoulder rotator cuff tear with no real clear-cut radicular symptoms from the neck.  In general her shoulder is better.  Examination today only show some AC joint tenderness otherwise is pretty benign.  Plan at this time is observation.  I will see her back as needed.  Could consider another injection if symptoms recur  Follow-Up Instructions: No follow-ups on file.   Orders:  No orders of the defined types were placed in this encounter.  No orders of the defined types were placed in this encounter.     Procedures: No procedures performed   Clinical Data: No additional findings.  Objective: Vital Signs: There were no vitals taken for this visit.  Physical Exam:   Constitutional: Patient  appears well-developed HEENT:  Head: Normocephalic Eyes:EOM are normal Neck: Normal range of motion Cardiovascular: Normal rate Pulmonary/chest: Effort normal Neurologic: Patient is alert Skin: Skin is warm Psychiatric: Patient has normal mood and affect    Ortho Exam: Ortho exam demonstrates full active and passive range of motion of the right shoulder.  No real weakness to infraspinatus supraspinatus and subscap testing.  She does have AC joint tenderness asymmetrically to the right compared to the left.  No other masses lymph adenopathy or skin changes noted in that shoulder girdle region.  Specialty Comments:  No specialty comments available.  Imaging: No results found.   PMFS History: Patient Active Problem List   Diagnosis Date Noted  . 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 Enteritis due to Clostridium difficile   . Osteomyelitis (HCC) 12/10/2013  . Fever 12/10/2013  . Sepsis (HCC) 12/10/2013  . Obstructive sleep apnea 12/10/2013  . Acute osteomyelitis of ankle and foot (HCC) 11/06/2013   Past Medical History:  Diagnosis Date  . Clostridium difficile diarrhea   . Fibromyalgia   . OSA on CPAP   . Osteomyelitis of toe (HCC)     History reviewed. No pertinent family history.  Past Surgical History:  Procedure Laterality Date  . biopsy of toe Right   . BLADDER SUSPENSION    . PERIPHERALLY INSERTED CENTRAL CATHETER INSERTION  11/2013  . TEMPOROMANDIBULAR JOINT ARTHROPLASTY Bilateral   . TOE DEBRIDEMENT Right   . VAGINAL HYSTERECTOMY     Social History   Occupational History  . Not on file  Tobacco Use  . Smoking status: Never Smoker  . Smokeless tobacco: Never Used  Substance and Sexual Activity  . Alcohol use: No  . Drug use: No  . Sexual activity: Never

## 2018-06-28 ENCOUNTER — Other Ambulatory Visit: Payer: Self-pay

## 2018-06-28 ENCOUNTER — Ambulatory Visit: Payer: BC Managed Care – PPO | Admitting: Orthopedic Surgery

## 2018-06-28 ENCOUNTER — Encounter: Payer: Self-pay | Admitting: Orthopedic Surgery

## 2018-06-28 DIAGNOSIS — M5412 Radiculopathy, cervical region: Secondary | ICD-10-CM | POA: Diagnosis not present

## 2018-06-28 MED ORDER — METHOCARBAMOL 500 MG PO TABS: ORAL_TABLET | ORAL | 0 refills | Status: DC

## 2018-07-02 ENCOUNTER — Encounter: Payer: Self-pay | Admitting: Orthopedic Surgery

## 2018-07-02 NOTE — Progress Notes (Signed)
Office Visit Note   Patient: Carrie [name]           Date of Birth: Sep 29, 1955           MRN: [id number] Visit Date: 06/28/2018 Requested by: Myrlene Broker, MD [place], [place] [address] PCP: Myrlene Broker, MD  Subjective: Chief Complaint  Patient presents with  . Right Shoulder - Pain  . Neck - Pain    HPI: Carrie [name] is a patient with right shoulder pain.  Last seen 10/2017.  Feels like it has moved into her neck.  She reports right-sided radicular pain as well as crepitus and grinding when she turns her head to the right.  She describes a lot of muscle spasm.  She also describes pain radiating into her scapular region.  Radiation to the right arm is present.  Symptoms are getting worse.  Been taking Aleve and Mobic without much help.  She is technically retired but has been working from home.              ROS: All systems reviewed are negative as they relate to the chief complaint within the history of present illness.  Patient denies  fevers or chills.   Assessment & Plan: Visit Diagnoses:  1. Cervical radiculopathy     Plan: Impression is cervical radiculopathy.  Been going on now for many months.  Plan MRI cervical spine to evaluate for right-sided radiculopathy and try Robaxin as a muscle relaxer.  Come back after that scan and will potentially consider some injections.  Shoulder examination fairly benign today.  Follow-Up Instructions: Return for after MRI.   Orders:  Orders Placed This Encounter  Procedures  . MR Cervical Spine w/o contrast   Meds ordered this encounter  Medications  . methocarbamol (ROBAXIN) 500 MG tablet    Sig: 1 po q 8 hrs prn    Dispense:  30 tablet    Refill:  0      Procedures: No procedures performed   Clinical Data: No additional findings.  Objective: Vital Signs: There were no vitals taken for this visit.  Physical Exam:   Constitutional: Patient appears well-developed HEENT:  Head: Normocephalic  Eyes:EOM are normal Neck: Normal range of motion Cardiovascular: Normal rate Pulmonary/chest: Effort normal Neurologic: Patient is alert Skin: Skin is warm Psychiatric: Patient has normal mood and affect    Ortho Exam: Ortho exam demonstrates good cervical spine range of motion but with some crepitus to palpation when she rotates her head right and left.  Motor or sensory function of the hand is intact.  Radial pulses intact.  No masses lymphadenopathy or skin changes noted in that shoulder girdle region.  Reflexes symmetric.  No definite paresthesias C5-T1.  Rotator cuff strength is excellent bilaterally.  Specialty Comments:  No specialty comments available.  Imaging: No results found.   PMFS History: Patient Active Problem List   Diagnosis Date Noted  . 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 Enteritis due to Clostridium difficile   . Osteomyelitis ([facility]) 12/10/2013  . Fever 12/10/2013  . Sepsis ([facility]) 12/10/2013  . Obstructive sleep apnea 12/10/2013  . Acute osteomyelitis of ankle and foot ([place]) 11/06/2013   Past Medical History:  Diagnosis Date  . Clostridium difficile diarrhea   . Fibromyalgia   . OSA on CPAP   . Osteomyelitis of toe ([facility])     History reviewed. No pertinent family history.  Past Surgical History:  Procedure Laterality Date  . biopsy  of toe Right   . BLADDER SUSPENSION    . PERIPHERALLY INSERTED CENTRAL CATHETER INSERTION  11/2013  . TEMPOROMANDIBULAR JOINT ARTHROPLASTY Bilateral   . TOE DEBRIDEMENT Right   . VAGINAL HYSTERECTOMY     Social History   Occupational History  . Not on file  Tobacco Use  . Smoking status: Never Smoker  . Smokeless tobacco: Never Used  Substance and Sexual Activity  . Alcohol use: No  . Drug use: No  . Sexual activity: Never

## 2018-07-12 ENCOUNTER — Ambulatory Visit
Admission: RE | Admit: 2018-07-12 | Discharge: 2018-07-12 | Disposition: A | Discharge status: Home / Self Care | Payer: BC Managed Care – PPO | Source: Ambulatory Visit | Attending: Orthopedic Surgery | Admitting: Orthopedic Surgery

## 2018-07-12 ENCOUNTER — Other Ambulatory Visit: Payer: Self-pay

## 2018-07-12 DIAGNOSIS — M5412 Radiculopathy, cervical region: Secondary | ICD-10-CM

## 2018-07-18 ENCOUNTER — Other Ambulatory Visit: Payer: BC Managed Care – PPO

## 2018-07-19 ENCOUNTER — Encounter: Payer: Self-pay | Admitting: Orthopedic Surgery

## 2018-07-19 ENCOUNTER — Ambulatory Visit (INDEPENDENT_AMBULATORY_CARE_PROVIDER_SITE_OTHER): Payer: BC Managed Care – PPO | Admitting: Orthopedic Surgery

## 2018-07-19 ENCOUNTER — Other Ambulatory Visit: Payer: Self-pay

## 2018-07-19 DIAGNOSIS — M5412 Radiculopathy, cervical region: Secondary | ICD-10-CM

## 2018-07-19 NOTE — Progress Notes (Signed)
Office Visit Note   Patient: Carrie [name]           Date of Birth: 10-23-1955           MRN: [id number] Visit Date: 07/19/2018 Requested by: Hadley Penobbins, Robert A, MD [address] [place] [id number] PCP: Hadley Penobbins, Robert A, MD  Subjective: Chief Complaint  Patient presents with  . Follow-up    HPI: Alvino Chapelllen is a patient with right shoulder and neck pain.  She is having some pain in the shoulder but a lot of pain in the shoulder blade and latissimus region.  Since have seen her she is had an MRI scan of both the shoulder and the neck.  Reviewed both of those with her today.  She does have a cervical disc at C5-6 which was only really lightly mentioned in the report but clearly this is a mild to moderate sized disc with contact but not compression of the spinal cord.  I think this could potentially be giving her some symptoms.  MRI scan of the right shoulder demonstrates small rotator cuff tear.  This is potentially more like a pinhole tear.  She has been doing a lot of pressure washing which is been hurting the shoulder arm neck and scapular region.  Injection into the shoulder did help her symptoms.              ROS: All systems reviewed are negative as they relate to the chief complaint within the history of present illness.  Patient denies  fevers or chills.   Normal OS ssessment & Plan: Visit Diagnoses: No diagnosis found.  Plan: Impression is cervical disc and right small rotator cuff tear.  It really sounds like the cervical disc is what is giving her most of her symptoms.  She has excellent strength and fairly seamless passive range of motion.  I would favor a diagnostic and therapeutic injection into her neck first by Dr. Alvester MorinNewton followed by potential evaluation and repeat injection into that subacromial space for the shoulder if that neck injection does not give her relief.  I will send her to Dr. Alvester MorinNewton for the shot and see her back about 8 weeks after that for repeat  clinical assessment.  Follow-Up Instructions: No follow-ups on file.   Orders:  No orders of the defined types were placed in this encounter.  No orders of the defined types were placed in this encounter.     Procedures: No procedures performed   Clinical Data: No additional findings.  Objective: Vital Signs: There were no vitals taken for this visit.  Physical Exam:   Constitutional: Patient appears well-developed HEENT:  Head: Normocephalic Eyes:EOM are normal Neck: Normal range of motion Cardiovascular: Normal rate Pulmonary/chest: Effort normal Neurologic: Patient is alert Skin: Skin is warm Psychiatric: Patient has normal mood and affect    Ortho Exam: Ortho exam demonstrates excellent rotator cuff strength in the shoulder with no restriction of passive range of motion.  Neck range of motion full also.  No definite paresthesias C5-T1.  No muscle atrophy in either arm.  Not much in the way of coarse grinding or crepitus at all in the right shoulder region.  Impingement signs only equivocally positive on the right negative on the left.  No AC joint tenderness on either side.  Specialty Comments:  No specialty comments available.  Imaging: No results found.   PMFS History: Patient Active Problem List   Diagnosis Date Noted  . Recurrent Clostridium  difficile diarrhea 02/17/2014  . Enteritis due to Clostridium difficile   . Osteomyelitis ([facility]) 12/10/2013  . Fever 12/10/2013  . Sepsis ([facility]) 12/10/2013  . Obstructive sleep apnea 12/10/2013  . Acute osteomyelitis of ankle and foot ([place]) 11/06/2013   Past Medical History:  Diagnosis Date  . Clostridium difficile diarrhea   . Fibromyalgia   . OSA on CPAP   . Osteomyelitis of toe ([facility])     History reviewed. No pertinent family history.  Past Surgical History:  Procedure Laterality Date  . biopsy of toe Right   . BLADDER SUSPENSION    . PERIPHERALLY INSERTED CENTRAL CATHETER INSERTION  11/2013  .  TEMPOROMANDIBULAR JOINT ARTHROPLASTY Bilateral   . TOE DEBRIDEMENT Right   . VAGINAL HYSTERECTOMY     Social History   Occupational History  . Not on file  Tobacco Use  . Smoking status: Never Smoker  . Smokeless tobacco: Never Used  Substance and Sexual Activity  . Alcohol use: No  . Drug use: No  . Sexual activity: Never

## 2018-07-24 ENCOUNTER — Telehealth: Payer: Self-pay | Admitting: Orthopedic Surgery

## 2018-07-24 DIAGNOSIS — M5412 Radiculopathy, cervical region: Secondary | ICD-10-CM

## 2018-07-24 NOTE — Telephone Encounter (Signed)
Pt called in said she would like a call back from lauren to discuss the injection that she's suppose to be getting in her neck.  [phone]

## 2018-07-24 NOTE — Telephone Encounter (Signed)
Patient was seen by Dr Marlou Sa on 06/26 and he had mentioned to her about getting cervical ESI but somehow it was missed. Can you please call her to get it scheduled. Thanks.

## 2018-07-25 NOTE — Telephone Encounter (Signed)
Referral sent to Dr. Ernestina Patches to advise on scheduling/ auth.

## 2018-08-14 ENCOUNTER — Encounter: Payer: Self-pay | Admitting: Physical Medicine and Rehabilitation

## 2018-08-14 ENCOUNTER — Other Ambulatory Visit: Payer: Self-pay

## 2018-08-14 ENCOUNTER — Ambulatory Visit: Payer: Self-pay

## 2018-08-14 ENCOUNTER — Ambulatory Visit (INDEPENDENT_AMBULATORY_CARE_PROVIDER_SITE_OTHER): Payer: BC Managed Care – PPO | Admitting: Physical Medicine and Rehabilitation

## 2018-08-14 VITALS — BP 135/77 | HR 61

## 2018-08-14 DIAGNOSIS — M5412 Radiculopathy, cervical region: Secondary | ICD-10-CM | POA: Diagnosis not present

## 2018-08-14 MED ORDER — METHYLPREDNISOLONE ACETATE 80 MG/ML IJ SUSP
80.0000 mg | Freq: Once | INTRAMUSCULAR | Status: AC
  Administered 2018-08-14: 80 mg

## 2018-08-14 NOTE — Progress Notes (Signed)
 .  Numeric Pain Rating Scale and Functional Assessment Average Pain 8   In the last MONTH (on 0-10 scale) has pain interfered with the following?  1. General activity like being  able to carry out your everyday physical activities such as walking, climbing stairs, carrying groceries, or moving a chair?  Rating(6)   +Driver, -BT, -Dye Allergies.  

## 2018-09-11 ENCOUNTER — Ambulatory Visit: Payer: BC Managed Care – PPO | Admitting: Orthopedic Surgery

## 2018-09-18 ENCOUNTER — Ambulatory Visit (INDEPENDENT_AMBULATORY_CARE_PROVIDER_SITE_OTHER): Payer: BC Managed Care – PPO | Admitting: Orthopedic Surgery

## 2018-09-18 ENCOUNTER — Telehealth: Payer: Self-pay

## 2018-09-18 ENCOUNTER — Encounter: Payer: Self-pay | Admitting: Orthopedic Surgery

## 2018-09-18 DIAGNOSIS — M5412 Radiculopathy, cervical region: Secondary | ICD-10-CM

## 2018-09-18 NOTE — Telephone Encounter (Signed)
Can you please get patient an appointment with Dr Lorin Mercy to discuss neck surgery-referred from Dr Marlou Sa.

## 2018-09-20 ENCOUNTER — Encounter: Payer: Self-pay | Admitting: Orthopedic Surgery

## 2018-09-20 NOTE — Progress Notes (Signed)
Office Visit Note   Patient: Carrie [name]           Date of Birth: 07/13/1955           MRN: [id number] Visit Date: 09/18/2018 Requested by: Hadley Penobbins, Robert A, MD [address] [place] [place],  [place] [id number] PCP: Hadley Penobbins, Robert A, MD  Subjective: Chief Complaint  Patient presents with  . Neck - Pain    HPI: Carrie [name] is a patient with neck and shoulder pain.  Injection with Dr. Alvester MorinNewton gave her really about 1 to 2 days of relief.  She reports recurrent pain with basic ADLs.  She has difficulty sleeping.'s been going on for about 8 or 9 years now but worse this past year.  She is using a heating pad.  Overall the relief was about 60% for several days.  It is hard for her to sleep.  She does report some shoulder blade pain more on the right-hand side than the left-hand side.  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              ROS: All systems reviewed are negative as they relate to the chief complaint within the history of present illness.  Patient denies  fevers or chills.   Assessment & Plan: Visit Diagnoses:  1. Cervical radiculopathy     Plan: Impression is cervical radiculopathy in a patient with single level disease.  We reviewed the MRI scan and I think she is a very good candidate for single level ACDF.  She is healthy thin and really has failed conservative management.  I will send her to Dr. Ophelia CharterYates for consideration for surgical management.  Follow-Up Instructions: No follow-ups on file.   Orders:  No orders of the defined types were placed in this encounter.  No orders of the defined types were placed in this encounter.     Procedures: No procedures performed   Clinical Data: No additional findings.  Objective: Vital Signs: There were no vitals taken for this visit.  Physical Exam:   Constitutional: Patient appears well-developed HEENT:  Head:  Normocephalic Eyes:EOM are normal Neck: Normal range of motion Cardiovascular: Normal rate Pulmonary/chest: Effort normal Neurologic: Patient is alert Skin: Skin is warm Psychiatric: Patient has normal mood and affect    Ortho Exam: Ortho exam demonstrates mild pain with rotation of the head.  Patient has good motor sensory strength in the upper extremities.  No definite paresthesias today.  Both hands are perfused.  Gait is normal.  Rest of exam is unchanged.  Specialty Comments:  No specialty comments available.  Imaging: No results found.   PMFS History: Patient Active Problem List   Diagnosis Date Noted  . 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 Enteritis due to Clostridium difficile   . Osteomyelitis (HCC) 12/10/2013  . Fever 12/10/2013  . Sepsis (HCC) 12/10/2013  . Obstructive sleep apnea 12/10/2013  . Acute osteomyelitis of ankle and foot (HCC) 11/06/2013   Past Medical History:  Diagnosis Date  . Clostridium difficile diarrhea   . Fibromyalgia   . OSA on CPAP   . Osteomyelitis of toe (HCC)     History reviewed. No pertinent family history.  Past Surgical History:  Procedure Laterality Date  . biopsy of toe Right   . BLADDER SUSPENSION    . PERIPHERALLY INSERTED CENTRAL CATHETER INSERTION  11/2013  . TEMPOROMANDIBULAR JOINT ARTHROPLASTY Bilateral   . TOE DEBRIDEMENT Right   . VAGINAL HYSTERECTOMY     Social History   Occupational History  . Not on file  Tobacco Use  . Smoking status: Never Smoker  . Smokeless tobacco: Never Used  Substance and Sexual Activity  . Alcohol use: No  . Drug use: No  . Sexual activity: Never

## 2018-09-23 NOTE — Progress Notes (Signed)
Carrie [name] Mainer - 63 y.o. female MRN [id number]  Date of birth: [birth year]  Office Visit Note: Visit Date: 08/14/2018 PCP: Hadley Penobbins, Robert A, MD Referred by: Hadley Penobbins, Robert A, MD  Subjective: Chief Complaint  Patient presents with  . Neck - Pain  . Middle Back - Pain   HPI:  Carrie [name] Dutton is a 63 y.o. female who comes in today For planned C7-T1 interlaminar epidural steroid injection.  She has been followed by Dr. Burnard BuntingG. Scott Dean.  She is failed conservative care including medication management and therapy.  She has 10 years of ongoing neck and mid back pain.  MRI shows right-sided foraminal narrowing mild at C5-6 and C6-7 with degenerative spondylosis.  She also has paracentral disc protrusion at C7-T1.  ROS Otherwise per HPI.  Assessment & Plan: Visit Diagnoses:  1. Cervical radiculopathy     Plan: No additional findings.   Meds & Orders:  Meds ordered this encounter  Medications  . methylPREDNISolone acetate (DEPO-MEDROL) injection 80 mg    Orders Placed This Encounter  Procedures  . XR C-ARM NO REPORT  . Epidural Steroid injection    Follow-up: No follow-ups on file.   Procedures: No procedures performed  Cervical Epidural Steroid Injection - Interlaminar Approach with Fluoroscopic Guidance  Patient: Carrie [name] Lecomte      Date of Birth: [birth year] MRN: [id number] PCP: Hadley Penobbins, Robert A, MD      Visit Date: 08/14/2018   Universal Protocol:    Date/Time: 08/31/205:20 AM  Consent Given By: the patient  Position: PRONE  Additional Comments: Vital signs were monitored before and after the procedure. Patient was prepped and draped in the usual sterile fashion. The correct patient, procedure, and site was verified.   Injection Procedure Details:  Procedure Site One Meds Administered:  Meds ordered this encounter  Medications  . methylPREDNISolone acetate (DEPO-MEDROL) injection 80 mg     Laterality: Right  Location/Site: C7-T1  Needle size: 20 G  Needle type:  Touhy  Needle Placement: Paramedian epidural space  Findings:  -Comments: Excellent flow of contrast into the epidural space.  Procedure Details: Using a paramedian approach from the side mentioned above, the region overlying the inferior lamina was localized under fluoroscopic visualization and the soft tissues overlying this structure were infiltrated with 4 ml. of 1% Lidocaine without Epinephrine. A # 20 gauge, Tuohy needle was inserted into the epidural space using a paramedian approach.  The epidural space was localized using loss of resistance along with lateral and contralateral oblique bi-planar fluoroscopic views.  After negative aspirate for air, blood, and CSF, a 2 ml. volume of Isovue-250 was injected into the epidural space and the flow of contrast was observed. Radiographs were obtained for documentation purposes.   The injectate was administered into the level noted above.  Additional Comments:  The patient tolerated the procedure well Dressing: 2 x 2 sterile gauze and Band-Aid    Post-procedure details: Patient was observed during the procedure. Post-procedure instructions were reviewed.  Patient left the clinic in stable condition.   Clinical History: MRI CERVICAL SPINE WITHOUT CONTRAST  TECHNIQUE: Multiplanar, multisequence MR imaging of the cervical spine was performed. No intravenous contrast was administered.  COMPARISON:  None.  FINDINGS: Alignment: Normal  Vertebrae: Normal bone marrow.  Negative for fracture or mass.  Cord: Normal signal and morphology  Posterior Fossa, vertebral arteries, paraspinal tissues: Negative  Disc levels:  C2-3: Negative  C3-4: Mild disc and facet degeneration without spinal or foraminal stenosis.  C4-5: Mild disc and  mild facet degeneration. Negative for spinal or foraminal stenosis  C5-6: Disc degeneration and spondylosis. Diffuse uncinate spurring with cord flattening and mild spinal stenosis. Mild  right foraminal narrowing. Left foramen widely patent.  C6-7: Disc degeneration and diffuse mild uncinate spurring. Mild right foraminal narrowing due to spurring. No cord deformity.  C7-T1: Small right paracentral disc protrusion without cord deformity or stenosis. Neural foramina widely patent.  IMPRESSION: Mild multilevel degenerative changes in the cervical spine. Mild right foraminal narrowing C5-6 and C6-7 due to spurring. Small right paracentral disc protrusion C7-T1 without neural impingement or stenosis.   Electronically Signed   By: Franchot Gallo M.D.   On: 07/13/2018 07:13     Objective:  VS:  HT:    WT:   BMI:     BP:135/77  HR:61bpm  TEMP: ( )  RESP:  Physical Exam  Ortho Exam Imaging: No results found.

## 2018-09-23 NOTE — Procedures (Signed)
Cervical Epidural Steroid Injection - Interlaminar Approach with Fluoroscopic Guidance  Patient: Carrie [name]      Date of Birth: 30-Dec-1955 MRN: [id number] PCP: Myrlene Broker, MD      Visit Date: 08/14/2018   Universal Protocol:    Date/Time: 08/31/205:20 AM  Consent Given By: the patient  Position: PRONE  Additional Comments: Vital signs were monitored before and after the procedure. Patient was prepped and draped in the usual sterile fashion. The correct patient, procedure, and site was verified.   Injection Procedure Details:  Procedure Site One Meds Administered:  Meds ordered this encounter  Medications  . methylPREDNISolone acetate (DEPO-MEDROL) injection 80 mg     Laterality: Right  Location/Site: C7-T1  Needle size: 20 G  Needle type: Touhy  Needle Placement: Paramedian epidural space  Findings:  -Comments: Excellent flow of contrast into the epidural space.  Procedure Details: Using a paramedian approach from the side mentioned above, the region overlying the inferior lamina was localized under fluoroscopic visualization and the soft tissues overlying this structure were infiltrated with 4 ml. of 1% Lidocaine without Epinephrine. A # 20 gauge, Tuohy needle was inserted into the epidural space using a paramedian approach.  The epidural space was localized using loss of resistance along with lateral and contralateral oblique bi-planar fluoroscopic views.  After negative aspirate for air, blood, and CSF, a 2 ml. volume of Isovue-250 was injected into the epidural space and the flow of contrast was observed. Radiographs were obtained for documentation purposes.   The injectate was administered into the level noted above.  Additional Comments:  The patient tolerated the procedure well Dressing: 2 x 2 sterile gauze and Band-Aid    Post-procedure details: Patient was observed during the procedure. Post-procedure instructions were reviewed.  Patient left  the clinic in stable condition.

## 2018-09-25 ENCOUNTER — Ambulatory Visit (INDEPENDENT_AMBULATORY_CARE_PROVIDER_SITE_OTHER): Payer: BC Managed Care – PPO | Admitting: Orthopaedic Surgery

## 2018-09-25 ENCOUNTER — Other Ambulatory Visit: Payer: Self-pay

## 2018-09-25 ENCOUNTER — Encounter: Payer: Self-pay | Admitting: Orthopaedic Surgery

## 2018-09-25 ENCOUNTER — Ambulatory Visit: Payer: Self-pay

## 2018-09-25 VITALS — BP 145/80 | HR 61 | Ht 63.0 in | Wt 132.0 lb

## 2018-09-25 DIAGNOSIS — M4802 Spinal stenosis, cervical region: Secondary | ICD-10-CM

## 2018-09-25 DIAGNOSIS — M5020 Other cervical disc displacement, unspecified cervical region: Secondary | ICD-10-CM

## 2018-09-25 DIAGNOSIS — M542 Cervicalgia: Secondary | ICD-10-CM

## 2018-09-25 NOTE — Progress Notes (Signed)
Office Visit Note   Patient: Carrie [name]           Date of Birth: 1956-01-11           MRN: [id number] Visit Date: 09/25/2018              Requested by: Hadley Penobbins, Robert A, MD [address] [place] [id number] PCP: Hadley Penobbins, Robert A, MD   Assessment & Plan: Visit Diagnoses:  1. Neck pain   2. Protrusion of cervical intervertebral disc   3. Spinal stenosis of cervical region     Plan: Patient has single level spondylosis with mild central stenosis at C5-6.  She only got a few days relief with cervical epidural steroid injection would like to proceed with operative intervention as Dr. August Saucerean and previously discussed with her.  Plan would be overnight stay C5-6 ACDF with allograft and plate.  We discussed postoperative collar mobilization risk of dysphonia dysphasia potential for pseudoarthrosis.  Questions were elicited and answered.  We looked at x-rays patient had had single level fusion procedure.  Patient understands and requests we proceed.  Follow-Up Instructions: No follow-ups on file.   Orders:  Orders Placed This Encounter  Procedures  . XR Cervical Spine 2 or 3 views   No orders of the defined types were placed in this encounter.     Procedures: No procedures performed   Clinical Data: No additional findings.   Subjective: Chief Complaint  Patient presents with  . Neck - Pain    HPI 63 year old female with cervical disc protrusion C5-6 and mild spinal stenosis referred to me by Dr. August Saucerean for failed conservative treatment.  She has been through anti-inflammatories, cervical epidural steroid injection without relief.  She has pain that radiates from her neck and her shoulder blade pain with neck rotation.  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  She works out regularly for many years.  She denies any myelopathic gait disturbance problems.  She is used  heating pad topical creams, prednisone Dosepak currently on Aleve without relief.  Patient has been working out regularly for many years.  Increased pain with lifting activities neck rotation neck flexion neck extension.  Review of Systems past history of toe infection that resolved with long-term PICC line antibiotics.  Possible history of C. difficile.  Sleep apnea.  Negative for CVA, negative cardiac history.   Objective: Vital Signs: BP (!) 145/80   Pulse 61   Ht 5\' 3"  (1.6 m)   Wt 132 lb (59.9 kg)   BMI 23.38 kg/m   Physical Exam Constitutional:      Appearance: She is well-developed.  HENT:     Head: Normocephalic.     Right Ear: External ear normal.     Left Ear: External ear normal.  Eyes:     Pupils: Pupils are equal, round, and reactive to light.  Neck:     Thyroid: No thyromegaly.     Trachea: No tracheal deviation.  Cardiovascular:     Rate and Rhythm: Normal rate.  Pulmonary:     Effort: Pulmonary effort is normal.  Abdominal:     Palpations: Abdomen is soft.  Skin:    General: Skin is warm and dry.  Neurological:     Mental Status: She is alert and oriented to person, place, and time.  Psychiatric:        Behavior:  Behavior normal.     Ortho Exam patient has increased pain with cervical compression some relief with distraction bilateral brachial plexus tenderness bilateral positive Spurling.  Upper extremity reflexes are 2+ and symmetrical.  No biceps triceps wrist flexion extension pronation supination weakness.  Normal heel toe gait no lower extremity clonus.  Specialty Comments:  No specialty comments available.  Imaging: CLINICAL DATA:  Cervical radiculopathy  EXAM: MRI CERVICAL SPINE WITHOUT CONTRAST  TECHNIQUE: Multiplanar, multisequence MR imaging of the cervical spine was performed. No intravenous contrast was administered.  COMPARISON:  None.  FINDINGS: Alignment: Normal  Vertebrae: Normal bone marrow.  Negative for fracture or  mass.  Cord: Normal signal and morphology  Posterior Fossa, vertebral arteries, paraspinal tissues: Negative  Disc levels:  C2-3: Negative  C3-4: Mild disc and facet degeneration without spinal or foraminal stenosis.  C4-5: Mild disc and mild facet degeneration. Negative for spinal or foraminal stenosis  C5-6: Disc degeneration and spondylosis. Diffuse uncinate spurring with cord flattening and mild spinal stenosis. Mild right foraminal narrowing. Left foramen widely patent.  C6-7: Disc degeneration and diffuse mild uncinate spurring. Mild right foraminal narrowing due to spurring. No cord deformity.  C7-T1: Small right paracentral disc protrusion without cord deformity or stenosis. Neural foramina widely patent.  IMPRESSION: Mild multilevel degenerative changes in the cervical spine. Mild right foraminal narrowing C5-6 and C6-7 due to spurring. Small right paracentral disc protrusion C7-T1 without neural impingement or stenosis.   Electronically Signed   By: Franchot Gallo M.D.   On: 07/13/2018 07:13   PMFS History: Patient Active Problem List   Diagnosis Date Noted  . Protrusion of cervical intervertebral disc 09/25/2018  . Spinal stenosis of cervical region 09/25/2018  . 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 Enteritis due to Clostridium difficile   . Osteomyelitis ([place]) 12/10/2013  . Fever 12/10/2013  . Sepsis ([place]) 12/10/2013  . Obstructive sleep apnea 12/10/2013  . Acute osteomyelitis of ankle and foot ([place]) 11/06/2013   Past Medical History:  Diagnosis Date  . Clostridium difficile diarrhea   . Fibromyalgia   . OSA on CPAP   . Osteomyelitis of toe ([place])     No family history on file.  Past Surgical History:  Procedure Laterality Date  . biopsy of toe Right   . BLADDER SUSPENSION    . PERIPHERALLY INSERTED CENTRAL CATHETER INSERTION  11/2013  . TEMPOROMANDIBULAR JOINT ARTHROPLASTY Bilateral   . TOE DEBRIDEMENT Right    . VAGINAL HYSTERECTOMY     Social History   Occupational History  . Not on file  Tobacco Use  . Smoking status: Never Smoker  . Smokeless tobacco: Never Used  Substance and Sexual Activity  . Alcohol use: No  . Drug use: No  . Sexual activity: Never

## 2018-10-09 ENCOUNTER — Encounter (HOSPITAL_COMMUNITY): Payer: Self-pay

## 2018-10-09 NOTE — Pre-Procedure Instructions (Signed)
Carrie [name]  10/09/2018      [facility] [address] [facility] - [place], [place] [address] Phone: [phone] Fax: [phone]  [id number] - [place], [place] [address] [place] [address] Phone: [phone] Fax: [phone]    Your procedure is scheduled on October 14, 2018.  Report to [facility] Entrance "[facility]" at 1:25 PM.  Call this number if you have problems the morning of [phone]   Remember:  Do not eat after midnight.  You may drink clear liquids until 1225 PM .  Clear liquids allowed are:   Water, Juice (non-citric and without pulp), Clear Tea, Black Coffee only and Gatorade   Please complete your PRE-SURGERY ENSURE that was provided to you by 1225 PM the morning of surgery.  Please, if able, drink it in one setting. DO NOT SIP.  Enhanced Recovery after Surgery for Orthopedics Enhanced Recovery after Surgery is a protocol used to improve the stress on your body and your recovery after surgery.  Patient Instructions  . The night before surgery:  o No food after midnight. ONLY clear liquids after midnight  .  Marland Kitchen The day of surgery (if you do NOT have diabetes):  o Drink ONE (1) Pre-Surgery Clear Ensure as directed.   o This drink was given to you during your hospital  pre-op appointment visit. o The pre-op nurse will instruct you on the time to drink the  Pre-Surgery Ensure depending on your surgery time. o Finish the drink at the designated time by the pre-op nurse.  o Nothing else to drink after completing the  Pre-Surgery Clear Ensure.         If you have questions, please contact your surgeon's office.   Take these medicines the morning of surgery with A SIP OF WATER -NONE  7 days prior to surgery STOP taking any Aspirin (unless otherwise instructed by your surgeon), Aleve, Naproxen, Ibuprofen, Motrin, Advil, Goody's, BC's, all herbal  medications, fish oil, and all vitamins   Day of surgery:  Do not wear jewelry, make-up or nail polish.  Do not wear lotions, powders, or perfumes, or deodorant.  Do not shave 48 hours prior to surgery.    Do not bring valuables to the hospital.  [facility] is not responsible for any belongings or valuables.  Contacts, dentures or bridgework may not be worn into surgery.  Leave your suitcase in the car.  After surgery it may be brought to your room.  For patients admitted to the hospital, discharge time will be determined by your treatment team.  Patients discharged the day of surgery will not be allowed to drive home.   [place]- Preparing For Surgery  Before surgery, you can play an important role. Because skin is not sterile, your skin needs to be as free of germs as possible. You can reduce the number of germs on your skin by washing with CHG (chlorahexidine gluconate) Soap before surgery.  CHG is an antiseptic cleaner which kills germs and bonds with the skin to continue killing germs even after washing.    Oral Hygiene is also important to reduce your risk of infection.  Remember - BRUSH YOUR TEETH THE MORNING OF SURGERY WITH YOUR REGULAR TOOTHPASTE  Please do not use if you have an allergy to CHG or antibacterial soaps. If your skin becomes reddened/irritated stop using  the CHG.  Do not shave (including legs and underarms) for at least 48 hours prior to first CHG shower. It is OK to shave your face.  Please follow these instructions carefully.   1. Shower the NIGHT BEFORE SURGERY and the MORNING OF SURGERY with CHG.   2. If you chose to wash your hair, wash your hair first as usual with your normal shampoo.  3. After you shampoo, rinse your hair and body thoroughly to remove the shampoo.  4. Use CHG as you would any other liquid soap. You can apply CHG directly to the skin and wash gently with a scrungie or a clean washcloth.   5. Apply the CHG Soap to your body ONLY FROM  THE NECK DOWN.  Do not use on open wounds or open sores. Avoid contact with your eyes, ears, mouth and genitals (private parts). Wash Face and genitals (private parts)  with your normal soap.  6. Wash thoroughly, paying special attention to the area where your surgery will be performed.  7. Thoroughly rinse your body with warm water from the neck down.  8. DO NOT shower/wash with your normal soap after using and rinsing off the CHG Soap.  9. Pat yourself dry with a CLEAN TOWEL.  10. Wear CLEAN PAJAMAS to bed the night before surgery, wear comfortable clothes the morning of surgery  11. Place CLEAN SHEETS on your bed the night of your first shower and DO NOT SLEEP WITH PETS.  Day of Surgery: Shower as above Do not apply any deodorants/lotions.  Please wear clean clothes to the hospital/surgery center.   Remember to brush your teeth WITH YOUR REGULAR TOOTHPASTE.  Please read over the following fact sheets that you were given.

## 2018-10-10 ENCOUNTER — Encounter (HOSPITAL_COMMUNITY)
Admission: RE | Admit: 2018-10-10 | Discharge: 2018-10-10 | Disposition: A | Discharge status: Home / Self Care | Payer: BC Managed Care – PPO | Source: Ambulatory Visit | Attending: Orthopaedic Surgery | Admitting: Orthopaedic Surgery

## 2018-10-10 ENCOUNTER — Encounter (HOSPITAL_COMMUNITY): Payer: Self-pay

## 2018-10-10 ENCOUNTER — Other Ambulatory Visit: Payer: Self-pay

## 2018-10-10 ENCOUNTER — Ambulatory Visit (HOSPITAL_COMMUNITY)
Admission: RE | Admit: 2018-10-10 | Discharge: 2018-10-10 | Disposition: A | Discharge status: Home / Self Care | Payer: BC Managed Care – PPO | Source: Ambulatory Visit | Attending: Surgery | Admitting: Surgery

## 2018-10-10 ENCOUNTER — Other Ambulatory Visit (HOSPITAL_COMMUNITY)
Admission: RE | Admit: 2018-10-10 | Discharge: 2018-10-10 | Disposition: A | Discharge status: Home / Self Care | Payer: BC Managed Care – PPO | Source: Ambulatory Visit | Attending: Orthopaedic Surgery | Admitting: Orthopaedic Surgery

## 2018-10-10 DIAGNOSIS — Z20828 Contact with and (suspected) exposure to other viral communicable diseases: Secondary | ICD-10-CM | POA: Diagnosis not present

## 2018-10-10 DIAGNOSIS — Z01818 Encounter for other preprocedural examination: Secondary | ICD-10-CM | POA: Insufficient documentation

## 2018-10-10 LAB — COMPREHENSIVE METABOLIC PANEL
ALT: 16 U/L (ref 0–44)
AST: 25 U/L (ref 15–41)
Albumin: 4.4 g/dL (ref 3.5–5.0)
Alkaline Phosphatase: 71 U/L (ref 38–126)
Anion gap: 11 (ref 5–15)
BUN: 15 mg/dL (ref 8–23)
CO2: 25 mmol/L (ref 22–32)
Calcium: 10.3 mg/dL (ref 8.9–10.3)
Chloride: 105 mmol/L (ref 98–111)
Creatinine, Ser: 0.82 mg/dL (ref 0.44–1.00)
GFR calc Af Amer: >60 mL/min (ref >60)
GFR calc non Af Amer: >60 mL/min (ref >60)
Glucose, Bld: 76 mg/dL (ref 70–99)
Potassium: 3.6 mmol/L (ref 3.5–5.1)
Sodium: 141 mmol/L (ref 135–145)
Total Bilirubin: 0.6 mg/dL (ref 0.3–1.2)
Total Protein: 7.2 g/dL (ref 6.5–8.1)

## 2018-10-10 LAB — TYPE AND SCREEN
ABO/RH(D): A POS
Antibody Screen: NEGATIVE

## 2018-10-10 LAB — CBC
HCT: 45.2 % (ref 36.0–46.0)
Hemoglobin: 14.7 g/dL (ref 12.0–15.0)
MCH: 30.8 pg (ref 26.0–34.0)
MCHC: 32.5 g/dL (ref 30.0–36.0)
MCV: 94.6 fL (ref 80.0–100.0)
Platelets: 252 10*3/uL (ref 150–400)
RBC: 4.78 MIL/uL (ref 3.87–5.11)
RDW: 13.1 % (ref 11.5–15.5)
WBC: 7 10*3/uL (ref 4.0–10.5)
nRBC: 0 % (ref 0.0–0.2)

## 2018-10-10 LAB — URINALYSIS, ROUTINE W REFLEX MICROSCOPIC
Bilirubin Urine: NEGATIVE
Glucose, UA: NEGATIVE mg/dL
Hgb urine dipstick: NEGATIVE
Ketones, ur: NEGATIVE mg/dL
Leukocytes,Ua: NEGATIVE
Nitrite: NEGATIVE
Protein, ur: NEGATIVE mg/dL
Specific Gravity, Urine: 1.004 — ABNORMAL LOW (ref 1.005–1.030)
pH: 7 (ref 5.0–8.0)

## 2018-10-10 LAB — SURGICAL PCR SCREEN
MRSA, PCR: NEGATIVE
Staphylococcus aureus: POSITIVE — AB

## 2018-10-10 LAB — ABO/RH: ABO/RH(D): A POS

## 2018-10-10 NOTE — Progress Notes (Signed)
PCP - Dr. Janace Litten, MD Cardiologist - denies  Chest x-ray - 10/10/2018 EKG - 10/10/2018 Stress Test - denies ECHO - denies Cardiac Cath - denies  Sleep Study - yes CPAP - yes; wears nightly; instructed to bring to hospital - mask, tubing, machine  Blood Thinner Instructions: N/A Aspirin Instructions: N/A  ERAS Protocol - Yes PRE-SURGERY Ensure - Given to patient; drink by [place] TEST- scheduled after PAT today, 10/10/2018; patient is aware of need to self-quarantine post testing  Coronavirus Screening  Have you experienced the following symptoms:  Cough yes/no: No Fever (>100.9F)  yes/no: No Runny nose yes/no: No Sore throat yes/no: No Difficulty breathing/shortness of breath  yes/no: No  Have you or a family member traveled in the last 14 days and where? yes/no: No    If the patient indicates "YES" to the above questions, their PAT will be rescheduled to limit the exposure to others and, the surgeon will be notified. THE PATIENT WILL NEED TO BE ASYMPTOMATIC FOR 14 DAYS.   If the patient is not experiencing any of these symptoms, the PAT nurse will instruct them to NOT bring anyone with them to their appointment since they may have these symptoms or traveled as well.   Please remind your patients and families that hospital visitation restrictions are in effect and the importance of the restrictions.    Anesthesia review: NO  Patient denies shortness of breath, fever, cough and chest pain at PAT appointment   Patient verbalized understanding of instructions that were given to them at the PAT appointment. Patient was also instructed that they will need to review over the PAT instructions again at home before surgery.

## 2018-10-11 LAB — NOVEL CORONAVIRUS, NAA (HOSP ORDER, SEND-OUT TO REF LAB; TAT 18-24 HRS): SARS-CoV-2, NAA: NOT DETECTED

## 2018-10-14 ENCOUNTER — Ambulatory Visit (HOSPITAL_COMMUNITY)
Admission: RE | Disposition: A | Discharge status: Home / Self Care | Payer: Self-pay | Source: Home / Self Care | Attending: Orthopaedic Surgery

## 2018-10-14 ENCOUNTER — Other Ambulatory Visit: Payer: Self-pay

## 2018-10-14 ENCOUNTER — Ambulatory Visit (HOSPITAL_COMMUNITY): Payer: BC Managed Care – PPO

## 2018-10-14 ENCOUNTER — Encounter (HOSPITAL_COMMUNITY): Payer: Self-pay

## 2018-10-14 ENCOUNTER — Observation Stay (HOSPITAL_COMMUNITY)
Admission: RE | Admit: 2018-10-14 | Discharge: 2018-10-15 | Disposition: A | Discharge status: Home / Self Care | Payer: BC Managed Care – PPO | Attending: Orthopaedic Surgery | Admitting: Orthopaedic Surgery

## 2018-10-14 ENCOUNTER — Ambulatory Visit (HOSPITAL_COMMUNITY): Payer: BC Managed Care – PPO | Admitting: Anesthesiology

## 2018-10-14 DIAGNOSIS — M47812 Spondylosis without myelopathy or radiculopathy, cervical region: Secondary | ICD-10-CM | POA: Diagnosis present

## 2018-10-14 DIAGNOSIS — Z888 Allergy status to other drugs, medicaments and biological substances status: Secondary | ICD-10-CM | POA: Insufficient documentation

## 2018-10-14 DIAGNOSIS — M5020 Other cervical disc displacement, unspecified cervical region: Secondary | ICD-10-CM | POA: Diagnosis present

## 2018-10-14 DIAGNOSIS — Z419 Encounter for procedure for purposes other than remedying health state, unspecified: Secondary | ICD-10-CM

## 2018-10-14 DIAGNOSIS — G4733 Obstructive sleep apnea (adult) (pediatric): Secondary | ICD-10-CM | POA: Diagnosis not present

## 2018-10-14 DIAGNOSIS — M50122 Cervical disc disorder at C5-C6 level with radiculopathy: Secondary | ICD-10-CM | POA: Insufficient documentation

## 2018-10-14 DIAGNOSIS — M4802 Spinal stenosis, cervical region: Secondary | ICD-10-CM | POA: Diagnosis present

## 2018-10-14 DIAGNOSIS — M4722 Other spondylosis with radiculopathy, cervical region: Secondary | ICD-10-CM | POA: Diagnosis not present

## 2018-10-14 HISTORY — PX: ANTERIOR CERVICAL DECOMP/DISCECTOMY FUSION: SHX1161

## 2018-10-14 SURGERY — ANTERIOR CERVICAL DECOMPRESSION/DISCECTOMY FUSION 1 LEVEL
Anesthesia: General | Site: Spine Cervical

## 2018-10-14 MED ORDER — BUPIVACAINE-EPINEPHRINE (PF) 0.5% -1:200000 IJ SOLN
INTRAMUSCULAR | Status: AC
  Filled 2018-10-14: qty 30

## 2018-10-14 MED ORDER — MENTHOL 3 MG MT LOZG: 1.0000 | LOZENGE | OROMUCOSAL | Status: DC | PRN

## 2018-10-14 MED ORDER — ONDANSETRON HCL 4 MG/2ML IJ SOLN
INTRAMUSCULAR | Status: DC | PRN
  Administered 2018-10-14: 4 mg via INTRAVENOUS

## 2018-10-14 MED ORDER — PROPOFOL 10 MG/ML IV BOLUS
INTRAVENOUS | Status: DC | PRN
  Administered 2018-10-14: 10 mg via INTRAVENOUS
  Administered 2018-10-14: 30 mg via INTRAVENOUS
  Administered 2018-10-14: 110 mg via INTRAVENOUS

## 2018-10-14 MED ORDER — MIDAZOLAM HCL 2 MG/2ML IJ SOLN
INTRAMUSCULAR | Status: AC
  Filled 2018-10-14: qty 2

## 2018-10-14 MED ORDER — PROMETHAZINE HCL 25 MG/ML IJ SOLN: 6.2500 mg | INTRAMUSCULAR | Status: DC | PRN

## 2018-10-14 MED ORDER — DEXAMETHASONE SODIUM PHOSPHATE 10 MG/ML IJ SOLN
INTRAMUSCULAR | Status: AC
  Filled 2018-10-14: qty 1

## 2018-10-14 MED ORDER — OXYCODONE HCL 5 MG/5ML PO SOLN: 5.0000 mg | Freq: Once | ORAL | Status: DC | PRN

## 2018-10-14 MED ORDER — SODIUM CHLORIDE 0.9% FLUSH: 3.0000 mL | INTRAVENOUS | Status: DC | PRN

## 2018-10-14 MED ORDER — PHENYLEPHRINE 40 MCG/ML (10ML) SYRINGE FOR IV PUSH (FOR BLOOD PRESSURE SUPPORT)
PREFILLED_SYRINGE | INTRAVENOUS | Status: AC
  Filled 2018-10-14: qty 10

## 2018-10-14 MED ORDER — SUCCINYLCHOLINE CHLORIDE 200 MG/10ML IV SOSY
PREFILLED_SYRINGE | INTRAVENOUS | Status: AC
  Filled 2018-10-14: qty 10

## 2018-10-14 MED ORDER — ONDANSETRON HCL 4 MG/2ML IJ SOLN
INTRAMUSCULAR | Status: AC
  Filled 2018-10-14: qty 2

## 2018-10-14 MED ORDER — CEFAZOLIN SODIUM-DEXTROSE 2-4 GM/100ML-% IV SOLN
2.0000 g | INTRAVENOUS | Status: AC
  Administered 2018-10-14: 2 g via INTRAVENOUS

## 2018-10-14 MED ORDER — ROCURONIUM BROMIDE 50 MG/5ML IV SOSY
PREFILLED_SYRINGE | INTRAVENOUS | Status: DC | PRN
  Administered 2018-10-14: 40 mg via INTRAVENOUS

## 2018-10-14 MED ORDER — PHENOL 1.4 % MT LIQD
1.0000 | OROMUCOSAL | Status: DC | PRN
  Administered 2018-10-14: 1 via OROMUCOSAL
  Filled 2018-10-14: qty 177

## 2018-10-14 MED ORDER — FENTANYL CITRATE (PF) 100 MCG/2ML IJ SOLN
25.0000 ug | INTRAMUSCULAR | Status: DC | PRN
  Administered 2018-10-14: 50 ug via INTRAVENOUS

## 2018-10-14 MED ORDER — ONDANSETRON HCL 4 MG PO TABS: 4.0000 mg | ORAL_TABLET | Freq: Four times a day (QID) | ORAL | Status: DC | PRN

## 2018-10-14 MED ORDER — BUPIVACAINE-EPINEPHRINE 0.5% -1:200000 IJ SOLN
INTRAMUSCULAR | Status: DC | PRN
  Administered 2018-10-14: 6 mL

## 2018-10-14 MED ORDER — POLYETHYLENE GLYCOL 3350 17 G PO PACK
17.0000 g | PACK | Freq: Every day | ORAL | Status: DC
  Filled 2018-10-14: qty 1

## 2018-10-14 MED ORDER — CALCIUM CARBONATE-VITAMIN D3 600-400 MG-UNIT PO TABS: 1.0000 | ORAL_TABLET | Freq: Every day | ORAL | Status: DC

## 2018-10-14 MED ORDER — SODIUM CHLORIDE 0.9% FLUSH
3.0000 mL | Freq: Two times a day (BID) | INTRAVENOUS | Status: DC
  Administered 2018-10-14: 3 mL via INTRAVENOUS

## 2018-10-14 MED ORDER — LIDOCAINE HCL (CARDIAC) PF 100 MG/5ML IV SOSY
PREFILLED_SYRINGE | INTRAVENOUS | Status: DC | PRN
  Administered 2018-10-14: 40 mg via INTRAVENOUS
  Administered 2018-10-14: 20 mg via INTRAVENOUS

## 2018-10-14 MED ORDER — METHOCARBAMOL 500 MG PO TABS
500.0000 mg | ORAL_TABLET | Freq: Four times a day (QID) | ORAL | Status: DC | PRN
  Administered 2018-10-14: 500 mg via ORAL

## 2018-10-14 MED ORDER — ACETAMINOPHEN 325 MG PO TABS: 650.0000 mg | ORAL_TABLET | ORAL | Status: DC | PRN

## 2018-10-14 MED ORDER — SUCCINYLCHOLINE CHLORIDE 20 MG/ML IJ SOLN
INTRAMUSCULAR | Status: DC | PRN
  Administered 2018-10-14: 80 mg via INTRAVENOUS

## 2018-10-14 MED ORDER — SODIUM CHLORIDE 0.9 % IV SOLN: INTRAVENOUS | Status: DC

## 2018-10-14 MED ORDER — HEMOSTATIC AGENTS (NO CHARGE) OPTIME
TOPICAL | Status: DC | PRN
  Administered 2018-10-14: 1

## 2018-10-14 MED ORDER — CHLORHEXIDINE GLUCONATE 4 % EX LIQD: 60.0000 mL | Freq: Once | CUTANEOUS | Status: DC

## 2018-10-14 MED ORDER — HYDROMORPHONE HCL 1 MG/ML IJ SOLN: 0.5000 mg | INTRAMUSCULAR | Status: DC | PRN

## 2018-10-14 MED ORDER — THROMBIN 5000 UNITS EX SOLR
CUTANEOUS | Status: DC | PRN
  Administered 2018-10-14: 14:00:00 5000 [IU] via TOPICAL

## 2018-10-14 MED ORDER — OXYCODONE HCL 5 MG PO TABS: 5.0000 mg | ORAL_TABLET | Freq: Once | ORAL | Status: DC | PRN

## 2018-10-14 MED ORDER — SODIUM CHLORIDE (HYPERTONIC) 2 % OP SOLN
1.0000 [drp] | Freq: Two times a day (BID) | OPHTHALMIC | Status: DC | PRN
  Filled 2018-10-14 (×2): qty 15

## 2018-10-14 MED ORDER — THROMBIN (RECOMBINANT) 5000 UNITS EX SOLR
CUTANEOUS | Status: AC
  Filled 2018-10-14: qty 5000

## 2018-10-14 MED ORDER — PHENYLEPHRINE HCL (PRESSORS) 10 MG/ML IV SOLN
INTRAVENOUS | Status: DC | PRN
  Administered 2018-10-14: 80 ug via INTRAVENOUS
  Administered 2018-10-14: 40 ug via INTRAVENOUS

## 2018-10-14 MED ORDER — PROPOFOL 10 MG/ML IV BOLUS
INTRAVENOUS | Status: AC
  Filled 2018-10-14: qty 40

## 2018-10-14 MED ORDER — CEFAZOLIN SODIUM-DEXTROSE 2-4 GM/100ML-% IV SOLN
INTRAVENOUS | Status: AC
  Filled 2018-10-14: qty 100

## 2018-10-14 MED ORDER — FENTANYL CITRATE (PF) 100 MCG/2ML IJ SOLN
INTRAMUSCULAR | Status: DC | PRN
  Administered 2018-10-14 (×3): 50 ug via INTRAVENOUS
  Administered 2018-10-14: 25 ug via INTRAVENOUS

## 2018-10-14 MED ORDER — METHOCARBAMOL 500 MG PO TABS
ORAL_TABLET | ORAL | Status: AC
  Filled 2018-10-14: qty 1

## 2018-10-14 MED ORDER — FENTANYL CITRATE (PF) 100 MCG/2ML IJ SOLN
INTRAMUSCULAR | Status: AC
  Filled 2018-10-14: qty 2

## 2018-10-14 MED ORDER — LACTATED RINGERS IV SOLN
INTRAVENOUS | Status: DC
  Administered 2018-10-14 (×2): via INTRAVENOUS

## 2018-10-14 MED ORDER — SODIUM CHLORIDE (HYPERTONIC) 2 % OP SOLN: 1.0000 [drp] | Freq: Two times a day (BID) | OPHTHALMIC | Status: DC | PRN

## 2018-10-14 MED ORDER — FENTANYL CITRATE (PF) 250 MCG/5ML IJ SOLN
INTRAMUSCULAR | Status: AC
  Filled 2018-10-14: qty 5

## 2018-10-14 MED ORDER — CEFAZOLIN SODIUM-DEXTROSE 1-4 GM/50ML-% IV SOLN
1.0000 g | Freq: Three times a day (TID) | INTRAVENOUS | Status: AC
  Administered 2018-10-14 (×2): 1 g via INTRAVENOUS
  Filled 2018-10-14 (×2): qty 50

## 2018-10-14 MED ORDER — CALCIUM CARBONATE-VITAMIN D 500-200 MG-UNIT PO TABS
1.0000 | ORAL_TABLET | Freq: Every day | ORAL | Status: DC
  Administered 2018-10-15: 08:00:00 1 via ORAL
  Filled 2018-10-14 (×4): qty 1

## 2018-10-14 MED ORDER — OXYCODONE HCL 5 MG PO TABS
5.0000 mg | ORAL_TABLET | ORAL | Status: DC | PRN
  Administered 2018-10-14 – 2018-10-15 (×4): 5 mg via ORAL
  Filled 2018-10-14 (×5): qty 1

## 2018-10-14 MED ORDER — METHOCARBAMOL 500 MG PO TABS
500.0000 mg | ORAL_TABLET | Freq: Four times a day (QID) | ORAL | Status: AC | PRN
  Administered 2018-10-14: 500 mg via ORAL
  Filled 2018-10-14: qty 1

## 2018-10-14 MED ORDER — DOCUSATE SODIUM 100 MG PO CAPS
100.0000 mg | ORAL_CAPSULE | Freq: Two times a day (BID) | ORAL | Status: DC
  Administered 2018-10-14 – 2018-10-15 (×2): 100 mg via ORAL
  Filled 2018-10-14 (×2): qty 1

## 2018-10-14 MED ORDER — SUGAMMADEX SODIUM 200 MG/2ML IV SOLN
INTRAVENOUS | Status: DC | PRN
  Administered 2018-10-14: 120 mg via INTRAVENOUS

## 2018-10-14 MED ORDER — LIDOCAINE 2% (20 MG/ML) 5 ML SYRINGE
INTRAMUSCULAR | Status: AC
  Filled 2018-10-14: qty 5

## 2018-10-14 MED ORDER — ROCURONIUM BROMIDE 10 MG/ML (PF) SYRINGE
PREFILLED_SYRINGE | INTRAVENOUS | Status: AC
  Filled 2018-10-14: qty 10

## 2018-10-14 MED ORDER — OXYCODONE-ACETAMINOPHEN 5-325 MG PO TABS: 1.0000 | ORAL_TABLET | Freq: Four times a day (QID) | ORAL | 0 refills | Status: AC | PRN

## 2018-10-14 MED ORDER — ONDANSETRON HCL 4 MG/2ML IJ SOLN: 4.0000 mg | Freq: Four times a day (QID) | INTRAMUSCULAR | Status: DC | PRN

## 2018-10-14 MED ORDER — METHOCARBAMOL 500 MG PO TABS: 500.0000 mg | ORAL_TABLET | Freq: Four times a day (QID) | ORAL | 0 refills | Status: AC | PRN

## 2018-10-14 MED ORDER — ACETAMINOPHEN 650 MG RE SUPP: 650.0000 mg | RECTAL | Status: DC | PRN

## 2018-10-14 MED ORDER — HYDROXYZINE HCL 50 MG/ML IM SOLN: 50.0000 mg | Freq: Four times a day (QID) | INTRAMUSCULAR | Status: DC | PRN

## 2018-10-14 MED ORDER — 0.9 % SODIUM CHLORIDE (POUR BTL) OPTIME
TOPICAL | Status: DC | PRN
  Administered 2018-10-14: 1000 mL

## 2018-10-14 MED ORDER — METHOCARBAMOL 1000 MG/10ML IJ SOLN: 500.0000 mg | Freq: Four times a day (QID) | INTRAVENOUS | Status: DC | PRN

## 2018-10-14 SURGICAL SUPPLY — 54 items
BENZOIN TINCTURE PRP APPL 2/3 (GAUZE/BANDAGES/DRESSINGS) ×2 IMPLANT
BIT DRILL SKYLINE 12MM (BIT) IMPLANT
BONE CERV LORDOTIC 14.5X12X7 (Bone Implant) ×2 IMPLANT
BUR ROUND FLUTED 4 SOFT TCH (BURR) ×1 IMPLANT
CLSR STERI-STRIP ANTIMIC 1/2X4 (GAUZE/BANDAGES/DRESSINGS) ×1 IMPLANT
COLLAR CERV LO CONTOUR FIRM DE (SOFTGOODS) ×2 IMPLANT
COVER MAYO STAND STRL (DRAPES) ×2 IMPLANT
COVER SURGICAL LIGHT HANDLE (MISCELLANEOUS) ×2 IMPLANT
COVER WAND RF STERILE (DRAPES) ×2 IMPLANT
DRAPE C-ARM 42X72 X-RAY (DRAPES) ×2 IMPLANT
DRAPE HALF SHEET 40X57 (DRAPES) ×2 IMPLANT
DRAPE MICROSCOPE LEICA (MISCELLANEOUS) ×2 IMPLANT
DRILL BIT SKYLINE 12MM (BIT) ×1
DURAPREP 6ML APPLICATOR 50/CS (WOUND CARE) ×2 IMPLANT
ELECT COATED BLADE 2.86 ST (ELECTRODE) ×2 IMPLANT
ELECT REM PT RETURN 9FT ADLT (ELECTROSURGICAL) ×2
ELECTRODE REM PT RTRN 9FT ADLT (ELECTROSURGICAL) ×1 IMPLANT
EVACUATOR 1/8 PVC DRAIN (DRAIN) ×2 IMPLANT
GAUZE SPONGE 4X4 12PLY STRL (GAUZE/BANDAGES/DRESSINGS) ×2 IMPLANT
GLOVE BIOGEL PI IND STRL 8 (GLOVE) ×2 IMPLANT
GLOVE BIOGEL PI INDICATOR 8 (GLOVE) ×2
GLOVE ORTHO TXT STRL SZ7.5 (GLOVE) ×4 IMPLANT
GOWN STRL REUS W/ TWL LRG LVL3 (GOWN DISPOSABLE) ×1 IMPLANT
GOWN STRL REUS W/ TWL XL LVL3 (GOWN DISPOSABLE) ×1 IMPLANT
GOWN STRL REUS W/TWL 2XL LVL3 (GOWN DISPOSABLE) ×2 IMPLANT
GOWN STRL REUS W/TWL LRG LVL3 (GOWN DISPOSABLE) ×1
GOWN STRL REUS W/TWL XL LVL3 (GOWN DISPOSABLE) ×1
GRAFT BNE SPCR VG2 14.5X12X7 (Bone Implant) IMPLANT
HALTER HD/CHIN CERV TRACTION D (MISCELLANEOUS) ×2 IMPLANT
KIT BASIN OR (CUSTOM PROCEDURE TRAY) ×2 IMPLANT
KIT TURNOVER KIT B (KITS) ×2 IMPLANT
MANIFOLD NEPTUNE II (INSTRUMENTS) ×2 IMPLANT
NDL 25GX 5/8IN NON SAFETY (NEEDLE) ×1 IMPLANT
NEEDLE 25GX 5/8IN NON SAFETY (NEEDLE) ×2 IMPLANT
NS IRRIG 1000ML POUR BTL (IV SOLUTION) ×2 IMPLANT
PACK ORTHO CERVICAL (CUSTOM PROCEDURE TRAY) ×2 IMPLANT
PAD ARMBOARD 7.5X6 YLW CONV (MISCELLANEOUS) ×4 IMPLANT
PATTIES SURGICAL .5 X.5 (GAUZE/BANDAGES/DRESSINGS) IMPLANT
PLATE ONE LEVEL SKYLINE 14MM (Plate) ×1 IMPLANT
POSITIONER HEAD DONUT 9IN (MISCELLANEOUS) ×2 IMPLANT
SCREW VARIABLE SELF TAP 12MM (Screw) ×4 IMPLANT
SPOGE SURGIFLO 8M (HEMOSTASIS) ×1
SPONGE SURGIFLO 8M (HEMOSTASIS) IMPLANT
STRIP CLOSURE SKIN 1/2X4 (GAUZE/BANDAGES/DRESSINGS) ×2 IMPLANT
SURGIFLO W/THROMBIN 8M KIT (HEMOSTASIS) IMPLANT
SUT BONE WAX W31G (SUTURE) ×2 IMPLANT
SUT VIC AB 3-0 PS2 18 (SUTURE) ×1
SUT VIC AB 3-0 PS2 18XBRD (SUTURE) ×1 IMPLANT
SUT VIC AB 4-0 PS2 27 (SUTURE) ×2 IMPLANT
SYR BULB 3OZ (MISCELLANEOUS) ×2 IMPLANT
TAPE CLOTH SURG 4X10 WHT LF (GAUZE/BANDAGES/DRESSINGS) ×1 IMPLANT
TOWEL GREEN STERILE (TOWEL DISPOSABLE) ×2 IMPLANT
TOWEL GREEN STERILE FF (TOWEL DISPOSABLE) ×2 IMPLANT
WATER STERILE IRR 1000ML POUR (IV SOLUTION) ×2 IMPLANT

## 2018-10-14 NOTE — Anesthesia Procedure Notes (Signed)
Procedure Name: Intubation Date/Time: 10/14/2018 1:01 PM Performed by: Scheryl Darter, CRNA Pre-anesthesia Checklist: Patient identified, Emergency Drugs available, Suction available and Patient being monitored Patient Re-evaluated:Patient Re-evaluated prior to induction Oxygen Delivery Method: Circle System Utilized Preoxygenation: Pre-oxygenation with 100% oxygen Induction Type: IV induction Ventilation: Mask ventilation without difficulty Laryngoscope Size: Glidescope Grade View: Grade II Tube type: Oral Tube size: 7.0 mm Number of attempts: 1 Airway Equipment and Method: Oral airway,  Rigid stylet and Video-laryngoscopy Placement Confirmation: ETT inserted through vocal cords under direct vision,  positive ETCO2 and breath sounds checked- equal and bilateral Secured at: 22 cm Tube secured with: Tape Dental Injury: Teeth and Oropharynx as per pre-operative assessment  Difficulty Due To: Difficulty was anticipated, Difficult Airway- due to reduced neck mobility, Difficult Airway- due to anterior larynx and Difficult Airway- due to limited oral opening

## 2018-10-14 NOTE — Anesthesia Preprocedure Evaluation (Addendum)
Anesthesia Evaluation  Patient identified by MRN, date of birth, ID band Patient awake    Reviewed: Allergy & Precautions, NPO status , Patient's Chart, lab work & pertinent test results  Airway Mallampati: II  TM Distance: >3 FB Neck ROM: Full    Dental no notable dental hx. (+) Teeth Intact   Pulmonary sleep apnea and Continuous Positive Airway Pressure Ventilation ,    Pulmonary exam normal breath sounds clear to auscultation       Cardiovascular Normal cardiovascular exam Rhythm:Regular Rate:Normal     Neuro/Psych  Neuromuscular disease negative psych ROS   GI/Hepatic negative GI ROS, Neg liver ROS,   Endo/Other  negative endocrine ROS  Renal/GU negative Renal ROS     Musculoskeletal   Abdominal   Peds  Hematology negative hematology ROS (+)   Anesthesia Other Findings   Reproductive/Obstetrics                            Anesthesia Physical Anesthesia Plan  ASA: II  Anesthesia Plan: General   Post-op Pain Management:    Induction: Intravenous  PONV Risk Score and Plan: 2 and Treatment may vary due to age or medical condition, Dexamethasone and Ondansetron  Airway Management Planned: Video Laryngoscope Planned and Oral ETT  Additional Equipment: None  Intra-op Plan:   Post-operative Plan: Extubation in OR  Informed Consent: I have reviewed the patients History and Physical, chart, labs and discussed the procedure including the risks, benefits and alternatives for the proposed anesthesia with the patient or authorized representative who has indicated his/her understanding and acceptance.     Dental advisory given  Plan Discussed with: Anesthesiologist and CRNA  Anesthesia Plan Comments:        Anesthesia Quick Evaluation

## 2018-10-14 NOTE — Transfer of Care (Signed)
Immediate Anesthesia Transfer of Care Note  Patient: Mikhia Dusek  Procedure(s) Performed: CERVICAL FIVE-  CERVICAL SIX ANTERIOR CERVICAL DECOMPRESSION/DISCECTOMY FUSION, ALLOGRAFT PLATE (N/A Spine Cervical)  Patient Location: PACU  Anesthesia Type:General  Level of Consciousness: drowsy and patient cooperative  Airway & Oxygen Therapy: Patient Spontanous Breathing  Post-op Assessment: Report given to RN and Post -op Vital signs reviewed and stable  Post vital signs: Reviewed and stable  Last Vitals:  Vitals Value Taken Time  BP 126/64 10/14/18 1507  Temp    Pulse 111 10/14/18 1508  Resp 18 10/14/18 1508  SpO2 99 % 10/14/18 1508  Vitals shown include unvalidated device data.  Last Pain:  Vitals:   10/14/18 1049  PainSc: 3       Patients Stated Pain Goal: 1 (48/18/56 3149)  Complications: No apparent anesthesia complications

## 2018-10-14 NOTE — Op Note (Signed)
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  Postop diagnosis: Same  Procedure: C5-6 anterior cervical discectomy and fusion, allograft and plate.  Surgeon: Rodell Perna, MD  Assistant: Benjiman Core, PA-C medically necessary and present for the entire procedure  Anesthesia: General anesthesia glide scope intubation and 6 cc Marcaine skin local.  Drains: 1 Hemovac neck  Implants: Synthes skyline 14 mm plate 12 mm screws x4. VG-2 7 mm cortical cancellus allograft at C5-6.  Complications: None  Procedure: After intubation arms tucked at the sides with the wrist restraints but ultra traction without weight neck was prepped with DuraPrep sterile skin marker after squaring area with towels Betadine Steri-Drape sterile female standard the head thyroid sheets and drapes and timeout procedure Ancef prophylaxis.  Incision was made starting at the midline extending to the left in line with the skin marker.  Platysma was divided in line with the fibers blunt dissection carotid sheath and contents lateral down to the prominent C5-6 anterior spur confirmed with crosstable draped sterile C arm and short 25 needle with a straight clamp.  Disc was marked removing portion of the disc and self-retaining retractors Cloward were placed right and left teeth blades smooth blades cephalad caudad.  Omohyoid was kept cephalad.  There was 1 mm disc space spurs removed anteriorly with the bur.  Discectomy was performed using Cloward curettes use of the bur and some additional bone was taken so that we could resect the posterior osteophytes which were contributing to epidural narrowing.  Disc material was present central and right corresponding with the MRI images and complete takedown posterior longitudinal ligament was performed uncovertebral joints were stripped.  Surgi-Flo was used in the epidural space.  Epidural space was dry and trial sizer showed some millimeter graft gave nice fit 6 was  slightly loose.  Traction was pulled by the CRNA graft was countersunk 2 mm some additional anterior spur removal selection of 14 mm plate held with a single screw confirmed with lateral and AP C arm adjusted all 4 screw holes were filled final spot pictures were taken with midline position of the plate in good position of all 4 screws on lateral images.  Tiny screwdriver was used to lock down all 4 screws Hemovac was placed with in and out technique on the left side in line with the skin incision.  Platysma reapproximated with 3-0 Vicryl 4-0 Vicryl subcuticular closure tincture benzoin Steri-Strips Marcaine infiltration 4 x 4's tape and soft collar was applied.  Instrument count needle count was correct.

## 2018-10-14 NOTE — Anesthesia Postprocedure Evaluation (Signed)
Anesthesia Post Note  Patient: Carrie [name]  Procedure(s) Performed: CERVICAL FIVE-  CERVICAL SIX ANTERIOR CERVICAL DECOMPRESSION/DISCECTOMY FUSION, ALLOGRAFT PLATE (N/A Spine Cervical)     Patient location during evaluation: PACU Anesthesia Type: General Level of consciousness: awake and alert Pain management: pain level controlled Vital Signs Assessment: post-procedure vital signs reviewed and stable Respiratory status: spontaneous breathing, nonlabored ventilation, respiratory function stable and patient connected to nasal cannula oxygen Cardiovascular status: blood pressure returned to baseline and stable Postop Assessment: no apparent nausea or vomiting Anesthetic complications: no    Last Vitals:  Vitals:   10/14/18 1545 10/14/18 1650  BP:  125/63  Pulse: 99 100  Resp: 16 20  Temp:  36.4 C  SpO2: 98% 98%    Last Pain:  Vitals:   10/14/18 1650  TempSrc: Oral  PainSc:                  Markell Schrier S

## 2018-10-14 NOTE — Progress Notes (Signed)
Orthopedic Tech Progress Note Patient Details:  Carrie [name] August 19, 1955 [id number]  Ortho Devices Type of Ortho Device: Soft collar Ortho Device/Splint Interventions: Vaughan Sine T 10/14/2018, 5:04 PM

## 2018-10-14 NOTE — H&P (Signed)
Patient: Carrie [name]                                     Date of Birth: May 18, 1955                                                    MRN: [id number] Visit Date: 09/25/2018                                                                     Requested by: Hadley Penobbins, Robert A, MD [address] [place] [place] [id number] PCP: Hadley Penobbins, Robert A, MD   Assessment & Plan: Visit Diagnoses:  1. Neck pain   2. Protrusion of cervical intervertebral disc   3. Spinal stenosis of cervical region     Plan: Patient has single level spondylosis with mild central stenosis at C5-6.  She only got a few days relief with cervical epidural steroid injection would like to proceed with operative intervention as Dr. August Saucerean and previously discussed with her.  Plan would be overnight stay C5-6 ACDF with allograft and plate.  We discussed postoperative collar mobilization risk of dysphonia dysphasia potential for pseudoarthrosis.  Questions were elicited and answered.  We looked at x-rays patient had had single level fusion procedure.  Patient understands and requests we proceed.  Follow-Up Instructions: No follow-ups on file.   Orders:  Orders Placed This Encounter  Procedures  . XR Cervical Spine 2 or 3 views   No orders of the defined types were placed in this encounter.     Procedures: No procedures performed   Clinical Data: No additional findings.   Subjective:    Chief Complaint  Patient presents with  . Neck - Pain    HPI 63 year old female with cervical disc protrusion C5-6 and mild spinal stenosis referred to me by Dr. August Saucerean for failed conservative treatment.  She has been through anti-inflammatories, cervical epidural steroid injection without relief.  She has pain that radiates from her neck and her shoulder blade pain with neck rotation.  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  She works out regularly for many years.  She denies any myelopathic gait disturbance problems.  She is used heating pad topical creams, prednisone Dosepak currently on Aleve without relief.  Patient has been working out regularly for many years.  Increased pain with lifting activities neck rotation neck flexion neck extension.  Review of Systems past history of toe infection that resolved with long-term PICC line antibiotics.  Possible history of C. difficile.  Sleep apnea.  Negative for CVA, negative cardiac history.   Objective: Vital Signs: BP (!) 145/80   Pulse 61   Ht 5\' 3"  (1.6 m)   Wt 132 lb (59.9 kg)   BMI 23.38 kg/m   Physical Exam Constitutional:      Appearance: She is well-developed.  HENT:     Head: Normocephalic.  Right Ear: External ear normal.     Left Ear: External ear normal.  Eyes:     Pupils: Pupils are equal, round, and reactive to light.  Neck:     Thyroid: No thyromegaly.     Trachea: No tracheal deviation.  Cardiovascular:     Rate and Rhythm: Normal rate.  Pulmonary:     Effort: Pulmonary effort is normal.  Abdominal:     Palpations: Abdomen is soft.  Skin:    General: Skin is warm and dry.  Neurological:     Mental Status: She is alert and oriented to person, place, and time.  Psychiatric:        Behavior: Behavior normal.     Ortho Exam patient has increased pain with cervical compression some relief with distraction bilateral brachial plexus tenderness bilateral positive Spurling.  Upper extremity reflexes are 2+ and symmetrical.  No biceps triceps wrist flexion extension pronation supination weakness.  Normal heel toe gait no lower extremity clonus.  Specialty Comments:  No specialty comments available.  Imaging: CLINICAL DATA: Cervical radiculopathy  EXAM: MRI CERVICAL SPINE WITHOUT CONTRAST  TECHNIQUE: Multiplanar, multisequence MR imaging of the cervical spine was performed. No intravenous contrast was  administered.  COMPARISON: None.  FINDINGS: Alignment: Normal  Vertebrae: Normal bone marrow. Negative for fracture or mass.  Cord: Normal signal and morphology  Posterior Fossa, vertebral arteries, paraspinal tissues: Negative  Disc levels:  C2-3: Negative  C3-4: Mild disc and facet degeneration without spinal or foraminal stenosis.  C4-5: Mild disc and mild facet degeneration. Negative for spinal or foraminal stenosis  C5-6: Disc degeneration and spondylosis. Diffuse uncinate spurring with cord flattening and mild spinal stenosis. Mild right foraminal narrowing. Left foramen widely patent.  C6-7: Disc degeneration and diffuse mild uncinate spurring. Mild right foraminal narrowing due to spurring. No cord deformity.  C7-T1: Small right paracentral disc protrusion without cord deformity or stenosis. Neural foramina widely patent.  IMPRESSION: Mild multilevel degenerative changes in the cervical spine. Mild right foraminal narrowing C5-6 and C6-7 due to spurring. Small right paracentral disc protrusion C7-T1 without neural impingement or stenosis.   Electronically Signed By: Franchot Gallo M.D. On: 07/13/2018 07:13   PMFS History:     Patient Active Problem List   Diagnosis Date Noted  . Protrusion of cervical intervertebral disc 09/25/2018  . Spinal stenosis of cervical region 09/25/2018  . 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 Enteritis due to Clostridium difficile   . Osteomyelitis ([place]) 12/10/2013  . Fever 12/10/2013  . Sepsis ([place]) 12/10/2013  . Obstructive sleep apnea 12/10/2013  . Acute osteomyelitis of ankle and foot ([place]) 11/06/2013       Past Medical History:  Diagnosis Date  . Clostridium difficile diarrhea   . Fibromyalgia   . OSA on CPAP   . Osteomyelitis of toe ([place])     No family history on file.       Past Surgical History:  Procedure Laterality Date  . biopsy of toe Right   . BLADDER  SUSPENSION    . PERIPHERALLY INSERTED CENTRAL CATHETER INSERTION  11/2013  . TEMPOROMANDIBULAR JOINT ARTHROPLASTY Bilateral   . TOE DEBRIDEMENT Right   . VAGINAL HYSTERECTOMY     Social History       Occupational History  . Not on file  Tobacco Use  . Smoking status: Never Smoker  . Smokeless tobacco: Never Used  Substance and Sexual Activity  . Alcohol use: No  . Drug use: No  . Sexual activity:  Never

## 2018-10-15 DIAGNOSIS — M4722 Other spondylosis with radiculopathy, cervical region: Secondary | ICD-10-CM | POA: Diagnosis not present

## 2018-10-15 MED FILL — Thrombin (Recombinant) For Soln 5000 Unit: CUTANEOUS | Qty: 5000 | Status: AC

## 2018-10-15 NOTE — Progress Notes (Signed)
   Subjective: 1 Day Post-Op Procedure(s) (LRB): CERVICAL FIVE-  CERVICAL SIX ANTERIOR CERVICAL DECOMPRESSION/DISCECTOMY FUSION, ALLOGRAFT PLATE (N/A) Patient reports pain as mild and moderate.  Shoulders feel tight. Arm pain gone.   Objective: Vital signs in last 24 hours: Temp:  [97.6 F (36.4 C)-99 F (37.2 C)] 98.3 F (36.8 C) (09/22 0346) Pulse Rate:  [87-103] 89 (09/22 0346) Resp:  [15-20] 18 (09/22 0346) BP: (113-131)/(63-76) 121/73 (09/22 0346) SpO2:  [97 %-100 %] 100 % (09/22 0346) Weight:  [60.3 kg] 60.3 kg (09/21 1038)  Intake/Output from previous day: 09/21 0701 - 09/22 0700 In: 1053 [I.V.:1003; IV Piggyback:50] Out: 20 [Drains:5; Blood:15] Intake/Output this shift: No intake/output data recorded.  No results for input(s): HGB in the last 72 hours. No results for input(s): WBC, RBC, HCT, PLT in the last 72 hours. No results for input(s): NA, K, CL, CO2, BUN, CREATININE, GLUCOSE, CALCIUM in the last 72 hours. No results for input(s): LABPT, INR in the last 72 hours.  Neurologically intact Dg Cervical Spine 2-3 Views  Result Date: 10/14/2018 CLINICAL DATA:  Cervical disc disease. EXAM: CERVICAL SPINE - 2-3 VIEW; DG C-ARM 1-60 MIN COMPARISON:  Radiographs dated 09/25/2018 FINDINGS: AP and lateral C-arm images demonstrate the patient has undergone anterior cervical fusion at C5-6. Plate and screws and interbody fusion plug appear in good position. Cervical alignment is normal. IMPRESSION: Anterior cervical fusion performed at C5-6. FLUOROSCOPY TIME:  15 seconds C-arm fluoroscopic images were obtained intraoperatively and submitted for post operative interpretation. Electronically Signed   By: Lorriane Shire M.D.   On: 10/14/2018 15:01   Dg C-arm 1-60 Min  Result Date: 10/14/2018 CLINICAL DATA:  Cervical disc disease. EXAM: CERVICAL SPINE - 2-3 VIEW; DG C-ARM 1-60 MIN COMPARISON:  Radiographs dated 09/25/2018 FINDINGS: AP and lateral C-arm images demonstrate the patient  has undergone anterior cervical fusion at C5-6. Plate and screws and interbody fusion plug appear in good position. Cervical alignment is normal. IMPRESSION: Anterior cervical fusion performed at C5-6. FLUOROSCOPY TIME:  15 seconds C-arm fluoroscopic images were obtained intraoperatively and submitted for post operative interpretation. Electronically Signed   By: Lorriane Shire M.D.   On: 10/14/2018 15:01    Assessment/Plan: 1 Day Post-Op Procedure(s) (LRB): CERVICAL FIVE-  CERVICAL SIX ANTERIOR CERVICAL DECOMPRESSION/DISCECTOMY FUSION, ALLOGRAFT PLATE (N/A) Plan: discharge home.   Marybelle Killings 10/15/2018, 7:45 AM

## 2018-10-15 NOTE — Discharge Instructions (Signed)
Use extra collar for shower wrapped in Saran wrap.  Your pain medication muscle ex was then sent into your pharmacy.  Office follow-up 1 week for dressing change and x-rays.  Avoid large chunks of food for a period of a few weeks since sometimes is difficult to swallow.  Liquids and soft foods will be easier to swallow.

## 2018-10-15 NOTE — Evaluation (Addendum)
Occupational Therapy Evaluation and Discharge  Patient Details Name: Carrie [name] MRN: [id number] DOB: 1955-10-30 Today's Date: 10/15/2018    History of Present Illness Pt is a 63 y/o female with cervical disc protrusion C5-6 and mild spinal stensosis. S/P ACDF C5-6.  PMH: fibromyalgia, osteomyelitis of toe.    Clinical Impression   PTA patient independent. Admitted for above and limited by pain, decreased activity tolerance. Educated on brace mgmt/wear schedule (24/7), cervical precautions, ADL compensatory techniques, DME, mobility, recommendations and safety.  Patient demonstrates ability to complete UB/LB self care, transfers and in room mobility without AD with modified independence, limited by pain.  Supervision for bed mobility for log roll technique.  Patient verbalized understanding with education and precautions.  She reports having 24/7 support initially as needed. Based on performance today, no further OT needs identified and OT will sign off. Thank you!     Follow Up Recommendations  No OT follow up;Supervision - Intermittent    Equipment Recommendations  None recommended by OT    Recommendations for Other Services       Precautions / Restrictions Precautions Precautions: Cervical Precaution Booklet Issued: Yes (comment) Precaution Comments: reviewed cervical precautions Required Braces or Orthoses: Cervical Brace Cervical Brace: Soft collar;At all times Restrictions Weight Bearing Restrictions: No      Mobility Bed Mobility Overal bed mobility: Needs Assistance Bed Mobility: Rolling;Sidelying to Sit;Sit to Sidelying Rolling: Supervision Sidelying to sit: Supervision     Sit to sidelying: Supervision General bed mobility comments: supervision to ensure proper log rolling technique  Transfers Overall transfer level: Modified independent Equipment used: None             General transfer comment: no assist required, good posture and technique      Balance Overall balance assessment: No apparent balance deficits (not formally assessed)                                         ADL either performed or assessed with clinical judgement   ADL Overall ADL's : Modified independent                                       General ADL Comments: patient demonstrates ability to complete ADLs, mobility and transfers with modified independence after education of cervical precautions and ADL compensatory techniques     Vision         Perception     Praxis      Pertinent Vitals/Pain Pain Assessment: No/denies pain Pain Score: 6  Pain Location: neck  Pain Descriptors / Indicators: Discomfort;Operative site guarding Pain Intervention(s): Monitored during session;Repositioned     Hand Dominance Right   Extremity/Trunk Assessment Upper Extremity Assessment Upper Extremity Assessment: Overall WFL for tasks assessed(within precautions )   Lower Extremity Assessment Lower Extremity Assessment: Overall WFL for tasks assessed   Cervical / Trunk Assessment Cervical / Trunk Assessment: Other exceptions Cervical / Trunk Exceptions: s/p ACDF   Communication Communication Communication: No difficulties   Cognition Arousal/Alertness: Awake/alert Behavior During Therapy: WFL for tasks assessed/performed Overall Cognitive Status: Within Functional Limits for tasks assessed                                     General  Comments       Exercises     Shoulder Instructions      Home Living Family/patient expects to be discharged to:: Private residence Living Arrangements: Children;Non-relatives/Friends Available Help at Discharge: Family;Friend(s);Available 24 hours/day Type of Home: House Home Access: Stairs to enter [employer] of Steps: 2   Home Layout: One level     Bathroom Shower/Tub: Producer, television/film/video: Standard     Home Equipment: Arts development officer Comments: she believes she has assess to shower stool, if not will self purchase      Prior Functioning/Environment Level of Independence: Independent                 OT Problem List: Decreased activity tolerance;Pain      OT Treatment/Interventions:      OT Goals(Current goals can be found in the care plan section) Acute Rehab OT Goals Patient Stated Goal: home and less pain OT Goal Formulation: With patient  OT Frequency:     Barriers to D/C:            Co-evaluation              AM-PAC OT "6 Clicks" Daily Activity     Outcome Measure Help from another person eating meals?: None Help from another person taking care of personal grooming?: None Help from another person toileting, which includes using toliet, bedpan, or urinal?: None Help from another person bathing (including washing, rinsing, drying)?: None Help from another person to put on and taking off regular upper body clothing?: None Help from another person to put on and taking off regular lower body clothing?: None 6 Click Score: 24   End of Session Equipment Utilized During Treatment: Cervical collar Nurse Communication: Mobility status  Activity Tolerance: Patient tolerated treatment well Patient left: in bed;with call bell/phone within reach  OT Visit Diagnosis: Pain Pain - Right/Left: (bil) Pain - part of body: Shoulder(neck-incisonal)                Time: 7106-2694 OT Time Calculation (min): 22 min Charges:  OT General Charges $OT Visit: 1 Visit OT Evaluation $OT Eval Low Complexity: 1 Low  Chancy Milroy, OT Acute Rehabilitation Services Pager [phone] Office [phone]   Chancy Milroy 10/15/2018, 9:06 AM

## 2018-10-15 NOTE — Plan of Care (Signed)
Pt and husband given D/C instructions with verbal understanding. Rx's were sent to pharmacy by MD. Pt's incision is clean and dry with no sign of infection. Pt's IV was removed prior to D/C. Pt D/C'd home via wheelchair per MD order. Pt is stable @ D/C and has no other needs at this time. Niv Darley, RN  

## 2018-10-16 ENCOUNTER — Encounter (HOSPITAL_COMMUNITY): Payer: Self-pay | Admitting: Orthopaedic Surgery

## 2018-10-21 NOTE — Discharge Summary (Signed)
Patient ID: Carrie [name] MRN: [id number] DOB/AGE: Nov 09, 1955 63 y.o.  Admit date: 10/14/2018 Discharge date: 10/15/2018  Admission Diagnoses:  Active Problems:   Protrusion of cervical intervertebral disc   Cervical spinal stenosis   Other spondylosis with radiculopathy, cervical region   Discharge Diagnoses:  Active Problems:   Protrusion of cervical intervertebral disc   Cervical spinal stenosis   Other spondylosis with radiculopathy, cervical region  status post Procedure(s): CERVICAL FIVE-  CERVICAL SIX ANTERIOR CERVICAL DECOMPRESSION/DISCECTOMY FUSION, ALLOGRAFT PLATE  Past Medical History:  Diagnosis Date  . Clostridium difficile diarrhea   . Fibromyalgia   . OSA on CPAP   . Osteomyelitis of toe ([facility])     Surgeries: Procedure(s): CERVICAL FIVE-  CERVICAL SIX ANTERIOR CERVICAL DECOMPRESSION/DISCECTOMY FUSION, ALLOGRAFT PLATE on 05/01/8117   Consultants:   Discharged Condition: Improved  Hospital Course: Carrie [name] is an 63 y.o. female who was admitted 10/14/2018 for operative treatment of cervical stenosis/HNP. Patient failed conservative treatments (please see the history and physical for the specifics) and had severe unremitting pain that affects sleep, daily activities and work/hobbies. After pre-op clearance, the patient was taken to the operating room on 10/14/2018 and underwent  Procedure(s): CERVICAL FIVE-  CERVICAL SIX ANTERIOR CERVICAL DECOMPRESSION/DISCECTOMY FUSION, ALLOGRAFT PLATE.    Patient was given perioperative antibiotics:  Anti-infectives (From admission, onward)   Start     Dose/Rate Route Frequency Ordered Stop   10/14/18 1700  ceFAZolin (ANCEF) IVPB 1 g/50 mL premix     1 g 100 mL/hr over 30 Minutes Intravenous Every 8 hours 10/14/18 1649 10/14/18 2212   10/14/18 1032  ceFAZolin (ANCEF) 2-4 GM/100ML-% IVPB    Note to Pharmacy: Cordelia Pen   : cabinet override      10/14/18 1032 10/14/18 1309   10/14/18 1030  ceFAZolin (ANCEF) IVPB 2g/100  mL premix     2 g 200 mL/hr over 30 Minutes Intravenous On call to O.R. 10/14/18 1029 10/14/18 1309       Patient was given sequential compression devices and early ambulation to prevent DVT.   Patient benefited maximally from hospital stay and there were no complications. At the time of discharge, the patient was urinating/moving their bowels without difficulty, tolerating a regular diet, pain is controlled with oral pain medications and they have been cleared by PT/OT.   Recent vital signs: No data found.   Recent laboratory studies: No results for input(s): WBC, HGB, HCT, PLT, NA, K, CL, CO2, BUN, CREATININE, GLUCOSE, INR, CALCIUM in the last 72 hours.  Invalid input(s): PT, 2   Discharge Medications:   Allergies as of 10/15/2018      Reactions   Phenazopyridine Palpitations, Other (See Comments)   Racing heart, pass out   Flagyl [metronidazole] Other (See Comments)   Sick on stomach  ? Drug interaction ?       Medication List    STOP taking these medications   TURMERIC PO     TAKE these medications   Calcium 600-D 600-400 MG-UNIT Tabs Generic drug: Calcium Carbonate-Vitamin D3 Take 1 tablet by mouth daily.   methocarbamol 500 MG tablet Commonly known as: Robaxin Take 1 tablet (500 mg total) by mouth every 6 (six) hours as needed for muscle spasms.   multivitamin with minerals tablet Take 1 tablet by mouth daily.   oxyCODONE-acetaminophen 5-325 MG tablet Commonly known as: PERCOCET/ROXICET Take 1 tablet by mouth every 6 (six) hours as needed for severe pain.   PROBIOTIC DAILY PO Take 1 capsule by  mouth daily.   sodium chloride 2 % ophthalmic solution Commonly known as: MURO 128 Place 1 drop into both eyes 2 (two) times daily as needed for eye irritation.   vitamin B-12 1000 MCG tablet Commonly known as: CYANOCOBALAMIN Take 1,000 mcg by mouth daily.       Diagnostic Studies: Dg Chest 2 View  Result Date: 10/10/2018 CLINICAL DATA:  Preop for spinal  surgery. EXAM: CHEST - 2 VIEW COMPARISON:  Radiographs of February 09, 2014. FINDINGS: The heart size and mediastinal contours are within normal limits. Both lungs are clear. The visualized skeletal structures are unremarkable. IMPRESSION: No active cardiopulmonary disease. Electronically Signed   By: Lupita Raider M.D.   On: 10/10/2018 14:46   Dg Cervical Spine 2-3 Views  Result Date: 10/14/2018 CLINICAL DATA:  Cervical disc disease. EXAM: CERVICAL SPINE - 2-3 VIEW; DG C-ARM 1-60 MIN COMPARISON:  Radiographs dated 09/25/2018 FINDINGS: AP and lateral C-arm images demonstrate the patient has undergone anterior cervical fusion at C5-6. Plate and screws and interbody fusion plug appear in good position. Cervical alignment is normal. IMPRESSION: Anterior cervical fusion performed at C5-6. FLUOROSCOPY TIME:  15 seconds C-arm fluoroscopic images were obtained intraoperatively and submitted for post operative interpretation. Electronically Signed   By: Francene Boyers M.D.   On: 10/14/2018 15:01   Dg C-arm 1-60 Min  Result Date: 10/14/2018 CLINICAL DATA:  Cervical disc disease. EXAM: CERVICAL SPINE - 2-3 VIEW; DG C-ARM 1-60 MIN COMPARISON:  Radiographs dated 09/25/2018 FINDINGS: AP and lateral C-arm images demonstrate the patient has undergone anterior cervical fusion at C5-6. Plate and screws and interbody fusion plug appear in good position. Cervical alignment is normal. IMPRESSION: Anterior cervical fusion performed at C5-6. FLUOROSCOPY TIME:  15 seconds C-arm fluoroscopic images were obtained intraoperatively and submitted for post operative interpretation. Electronically Signed   By: Francene Boyers M.D.   On: 10/14/2018 15:01   Xr Cervical Spine 2 Or 3 Views  Result Date: 09/25/2018 AP lateral cervical spine x-rays show cervical spondylosis with disc base narrowing at C5-6 and to lesser degree C6-7.  Posterior osteophytes are noted at the C5-6 level.  Uncovertebral changes noted on AP x-ray worse at C5-6.  Impression: Cervical spondylosis C5-6.  Mild changes C6-7.     Follow-up Information    Naida Sleight, PA-C Follow up in 1 week(s).   Specialties: Physician Assistant, Orthopedic Surgery Contact information: [address] [place] [address] [phone]           Discharge Plan:  discharge to home  Disposition:     Signed: Zonia Kief for [facility] yates 10/21/2018, 3:54 PM

## 2018-10-23 ENCOUNTER — Ambulatory Visit: Payer: Self-pay

## 2018-10-23 ENCOUNTER — Encounter: Payer: Self-pay | Admitting: Surgery

## 2018-10-23 ENCOUNTER — Ambulatory Visit (INDEPENDENT_AMBULATORY_CARE_PROVIDER_SITE_OTHER): Payer: BC Managed Care – PPO | Admitting: Surgery

## 2018-10-23 DIAGNOSIS — Z981 Arthrodesis status: Secondary | ICD-10-CM

## 2018-10-23 NOTE — Progress Notes (Signed)
   Office Visit Note   Patient: Carrie [name]           Date of Birth: 07-13-1955           MRN: [id number] Visit Date: 10/23/2018              Requested by: Myrlene Broker, MD [place],  [place] [address] PCP: Myrlene Broker, MD   Assessment & Plan: Visit Diagnoses:  1. S/P cervical spinal fusion     Plan: Patient continue wearing cervical collar until she is 6 weeks postop.  Follow-up in 5 weeks for recheck.  No driving, lifting, pushing, pulling.  Follow-Up Instructions: Return in about 5 weeks (around 11/27/2018).   Orders:  Orders Placed This Encounter  Procedures  . XR Cervical Spine 2 or 3 views   No orders of the defined types were placed in this encounter.     Procedures: No procedures performed   Clinical Data: No additional findings.   Subjective: Chief Complaint  Patient presents with  . Neck - Routine Post Op    HPI 63 year old white female who is 1 week status post C5-6 ACDF returns.  States that she is doing well.  Preop scapular pain is gone.  pleased up to this point.   Objective: Vital Signs: There were no vitals taken for this visit.  Physical Exam Wound looks good.  Healing well without signs infection.  New Steri-Strips applied. Ortho Exam  Specialty Comments:  No specialty comments available.  Imaging: No results found.   PMFS History: Patient Active Problem List   Diagnosis Date Noted  . Cervical spinal stenosis 10/14/2018  . Other spondylosis with radiculopathy, cervical region   . Protrusion of cervical intervertebral disc 09/25/2018  . Spinal stenosis of cervical region 09/25/2018  . 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 Enteritis due to Clostridium difficile   . Osteomyelitis ([facility]) 12/10/2013  . Fever 12/10/2013  . Sepsis ([facility]) 12/10/2013  . Obstructive sleep apnea 12/10/2013  . Acute osteomyelitis of ankle and foot ([facility]) 11/06/2013   Past Medical History:  Diagnosis Date  .  Clostridium difficile diarrhea   . Fibromyalgia   . OSA on CPAP   . Osteomyelitis of toe ([facility])     History reviewed. No pertinent family history.  Past Surgical History:  Procedure Laterality Date  . ANTERIOR CERVICAL DECOMP/DISCECTOMY FUSION N/A 10/14/2018   Procedure: CERVICAL FIVE-  CERVICAL SIX ANTERIOR CERVICAL DECOMPRESSION/DISCECTOMY FUSION, ALLOGRAFT PLATE;  Surgeon: Marybelle Killings, MD;  Location: [facility];  Service: Orthopedics;  Laterality: N/A;  . biopsy of toe Right   . BLADDER SUSPENSION    . PERIPHERALLY INSERTED CENTRAL CATHETER INSERTION  11/2013  . TEMPOROMANDIBULAR JOINT ARTHROPLASTY Bilateral   . TMJ ARTHROPLASTY  1988  . TOE DEBRIDEMENT Right   . VAGINAL HYSTERECTOMY     Social History   Occupational History  . Not on file  Tobacco Use  . Smoking status: Never Smoker  . Smokeless tobacco: Never Used  Substance and Sexual Activity  . Alcohol use: No  . Drug use: No  . Sexual activity: Never

## 2018-11-28 ENCOUNTER — Ambulatory Visit: Payer: Self-pay

## 2018-11-28 ENCOUNTER — Ambulatory Visit (INDEPENDENT_AMBULATORY_CARE_PROVIDER_SITE_OTHER): Payer: BC Managed Care – PPO | Admitting: Surgery

## 2018-11-28 ENCOUNTER — Encounter: Payer: Self-pay | Admitting: Surgery

## 2018-11-28 ENCOUNTER — Other Ambulatory Visit: Payer: Self-pay

## 2018-11-28 DIAGNOSIS — Z981 Arthrodesis status: Secondary | ICD-10-CM

## 2018-11-28 NOTE — Progress Notes (Signed)
63 year old white female who is 6-week status post C5-6 ACDF returns.  States that she is doing well.  Again preop pain is resolved.  She is complaining of some bilateral trapezius spasms.  She is hoping to come out of her collar.  Exam Very pleasant female alert and oriented in no acute distress.  Gait is normal.  Neurologically intact.  X-rays Cervical spine sure hardware to be intact.  Graft in good position.  No complicating features.   Plan Patient can discontinue soft collar.  I recommend that she still not do any heavy pushing pulling lifting.  Follow-up in 6 weeks for recheck with Dr. Lorin Mercy.  Anticipate release at that time.

## 2018-12-26 ENCOUNTER — Telehealth: Payer: Self-pay

## 2018-12-26 NOTE — Telephone Encounter (Signed)
Patient has been calling and no one has answered. Patient was upset about this. I did not see any msgs. I did apologize.   States she is having pain in the neck and upper back and would like to know if she needs to do PT or what is the next step. She has f/u appt on 01/08/2019.   CB [id number]

## 2018-12-27 NOTE — Telephone Encounter (Signed)
I called pt,     She has ROV to see me 12/16. She is OK with that. FYi

## 2018-12-27 NOTE — Telephone Encounter (Signed)
I called patient. She is having what feels like muscular pain in her neck and back. She also has a "knot" that she feels in her shoulder area. She has had these symptoms since coming out of the collar, but it has progressively gotten worse. She states that she has had someone massage the shoulder and work on the knot and that this gets better. She also notices that it feels like her muscles pull when she turns her head to the left. This pain is different than her pre-op pain. She is taking robaxin which helps some, but not completely. She feels that she can work some of this out, but is afraid she could be doing incorrect exercises for recent surgery. She would like to know if you think she would benefit from PT?  Any other options?

## 2018-12-27 NOTE — Telephone Encounter (Signed)
noted 

## 2019-01-08 ENCOUNTER — Other Ambulatory Visit: Payer: Self-pay

## 2019-01-08 ENCOUNTER — Encounter: Payer: Self-pay | Admitting: Orthopaedic Surgery

## 2019-01-08 ENCOUNTER — Ambulatory Visit (INDEPENDENT_AMBULATORY_CARE_PROVIDER_SITE_OTHER): Payer: BC Managed Care – PPO | Admitting: Orthopaedic Surgery

## 2019-01-08 DIAGNOSIS — Z981 Arthrodesis status: Secondary | ICD-10-CM | POA: Insufficient documentation

## 2019-01-08 NOTE — Progress Notes (Signed)
Office Visit Note   Patient: Carrie [name]           Date of Birth: 03/23/1955           MRN: [id number] Visit Date: 01/08/2019              Requested by: Hadley Pen, MD [address] [place],  [place] [address] PCP: Hadley Pen, MD   Assessment & Plan: Visit Diagnoses:  1. S/P cervical spinal fusion     Plan: Patient has persistent right shoulder symptoms from tendinopathy biceps tendon intra-articular long head and also rotator cuff tendinopathy.  She is not sure she wants to consider shoulder surgery at this point she can follow-up with Dr. August Saucer after the holidays.  She is happy the results of the cervical fusion.  We reviewed exercises that she does with weights that are likely to give her significant increase tension on the long head of the biceps tendon and supraspinatus.  She can look a little bit more aerobic work and decrease weight amounts when she resumes working out which may improve her symptoms.  Of concern is a persistent problems with her shoulder even when she has not been working out for the last 8 months.  She will follow up with her shoulder with Dr. August Saucer.  Follow-Up Instructions: No follow-ups on file.   Orders:  No orders of the defined types were placed in this encounter.  No orders of the defined types were placed in this encounter.     Procedures: No procedures performed   Clinical Data: No additional findings.   Subjective: Chief Complaint  Patient presents with  . Neck - Follow-up    10/14/2018 C5-6 ACDF, Allograft, Plate    HPI 63 year old female returns 3 months post C5-6 fusion.  Patient states she has not been lifting weights or working out since March.  Good relief her preop neck pain she states she probably has 70% pain relief.  She has some pain in her shoulder still over the long head of the biceps where she had some biceps tendinopathy also laterally near the deltoid insertion site related to rotator cuff tendinopathy  which is followed by Dr. August Saucer.  She also has some pain in the right latissimus region.  This and not related to spicy food or inspiration.  No tenderness in the midline.  This is about T8-T10 region.  No long track signs normal gait.  Review of Systems 14 point system update unchanged.   Objective: Vital Signs: BP (!) 123/59   Pulse 75   Ht 5\' 3"  (1.6 m)   Wt 133 lb (60.3 kg)   BMI 23.56 kg/m   Physical Exam Constitutional:      Appearance: She is well-developed.  HENT:     Head: Normocephalic.     Right Ear: External ear normal.     Left Ear: External ear normal.  Eyes:     Pupils: Pupils are equal, round, and reactive to light.  Neck:     Thyroid: No thyromegaly.     Trachea: No tracheal deviation.  Cardiovascular:     Rate and Rhythm: Normal rate.  Pulmonary:     Effort: Pulmonary effort is normal.  Abdominal:     Palpations: Abdomen is soft.  Skin:    General: Skin is warm and dry.  Neurological:     Mental Status: She is alert and oriented to person, place, and time.  Psychiatric:  Behavior: Behavior normal.     Ortho Exam well-healed cervical incision no brachial plexus tenderness.  Right greater than left long of the biceps tendon tenderness negative Yergason.  Positive impingement right shoulder.  Upper extremity reflexes are 2+ and symmetrical.  Good flexion-extension cervical spine now without pain.  Specialty Comments:  No specialty comments available.  Imaging: No results found.   PMFS History: Patient Active Problem List   Diagnosis Date Noted  . S/P cervical spinal fusion 01/08/2019  . Other spondylosis with radiculopathy, cervical region   . 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 Enteritis due to Clostridium difficile   . Osteomyelitis ([facility]) 12/10/2013  . Fever 12/10/2013  . Sepsis ([facility]) 12/10/2013  . Obstructive sleep apnea 12/10/2013  . Acute osteomyelitis of ankle and foot ([facility]) 11/06/2013   Past Medical History:    Diagnosis Date  . Clostridium difficile diarrhea   . Fibromyalgia   . OSA on CPAP   . Osteomyelitis of toe ([facility])     No family history on file.  Past Surgical History:  Procedure Laterality Date  . ANTERIOR CERVICAL DECOMP/DISCECTOMY FUSION N/A 10/14/2018   Procedure: CERVICAL FIVE-  CERVICAL SIX ANTERIOR CERVICAL DECOMPRESSION/DISCECTOMY FUSION, ALLOGRAFT PLATE;  Surgeon: Marybelle Killings, MD;  Location: [facility];  Service: Orthopedics;  Laterality: N/A;  . biopsy of toe Right   . BLADDER SUSPENSION    . PERIPHERALLY INSERTED CENTRAL CATHETER INSERTION  11/2013  . TEMPOROMANDIBULAR JOINT ARTHROPLASTY Bilateral   . TMJ ARTHROPLASTY  1988  . TOE DEBRIDEMENT Right   . VAGINAL HYSTERECTOMY     Social History   Occupational History  . Not on file  Tobacco Use  . Smoking status: Never Smoker  . Smokeless tobacco: Never Used  Substance and Sexual Activity  . Alcohol use: No  . Drug use: No  . Sexual activity: Never

## 2019-02-03 ENCOUNTER — Other Ambulatory Visit: Payer: Self-pay

## 2019-02-03 ENCOUNTER — Encounter: Payer: Self-pay | Admitting: Orthopedic Surgery

## 2019-02-03 ENCOUNTER — Ambulatory Visit (INDEPENDENT_AMBULATORY_CARE_PROVIDER_SITE_OTHER): Payer: BC Managed Care – PPO | Admitting: Orthopedic Surgery

## 2019-02-03 VITALS — Ht 63.0 in | Wt 130.0 lb

## 2019-02-03 DIAGNOSIS — M25511 Pain in right shoulder: Secondary | ICD-10-CM | POA: Diagnosis not present

## 2019-02-06 ENCOUNTER — Ambulatory Visit (INDEPENDENT_AMBULATORY_CARE_PROVIDER_SITE_OTHER): Payer: BC Managed Care – PPO | Admitting: Physical Therapy

## 2019-02-06 ENCOUNTER — Other Ambulatory Visit: Payer: Self-pay

## 2019-02-06 ENCOUNTER — Encounter: Payer: Self-pay | Admitting: Physical Therapy

## 2019-02-06 DIAGNOSIS — G8929 Other chronic pain: Secondary | ICD-10-CM

## 2019-02-06 DIAGNOSIS — M6281 Muscle weakness (generalized): Secondary | ICD-10-CM | POA: Diagnosis not present

## 2019-02-06 DIAGNOSIS — R29898 Other symptoms and signs involving the musculoskeletal system: Secondary | ICD-10-CM

## 2019-02-06 DIAGNOSIS — R293 Abnormal posture: Secondary | ICD-10-CM | POA: Diagnosis not present

## 2019-02-06 DIAGNOSIS — M25511 Pain in right shoulder: Secondary | ICD-10-CM

## 2019-02-06 NOTE — Patient Instructions (Signed)
Access Code: [email or web address]  URL: [email or web address]  Date: 02/06/2019  Prepared by: Moshe Cipro   Exercises Doorway Pec Stretch at 90 Degrees Abduction - 3 reps - 1 sets - 30 sec hold - 1x daily - 7x weekly Supine Thoracic Extension on Swiss Ball - 3 reps - 1 sets - 10-20 sec hold - 1x daily - 7x weekly Prone Middle Trapezius Strengthening on Swiss Ball - 10 reps - 3 sets - 1-2 sec hold - 1x daily - 7x weekly Prone Shoulder Extension on Swiss Ball - 10 reps - 3 sets - 1-2 sec hold - 1x daily - 7x weekly Standing Bent Over Single Arm Shoulder Row with Dumbbells - 10 reps - 3 sets - 1-2 sec hold - 1x daily - 7x weekly Patient Education Trigger Point Dry Needling

## 2019-02-07 NOTE — Therapy (Signed)
[facility] Physical Therapy [address] [place], [place], [address] Phone: [phone]   Fax:  [phone]  Physical Therapy Evaluation  Patient Details  Name: Carrie [name] MRN: [id number] Date of Birth: 1955-03-09 Referring Provider (PT): August Saucer Corrie Mckusick, MD   Encounter Date: 02/06/2019  PT End of Session - 02/06/19 1621    Visit Number  1    Number of Visits  6    Date for PT Re-Evaluation  03/20/19    PT Start Time  1524    PT Stop Time  1608    PT Time Calculation (min)  44 min    Activity Tolerance  Patient tolerated treatment well    Behavior During Therapy  [facility] for tasks assessed/performed       Past Medical History:  Diagnosis Date  . Clostridium difficile diarrhea   . Fibromyalgia   . OSA on CPAP   . Osteomyelitis of toe [facility])     Past Surgical History:  Procedure Laterality Date  . ANTERIOR CERVICAL DECOMP/DISCECTOMY FUSION N/A 10/14/2018   Procedure: CERVICAL FIVE-  CERVICAL SIX ANTERIOR CERVICAL DECOMPRESSION/DISCECTOMY FUSION, ALLOGRAFT PLATE;  Surgeon: Eldred Manges, MD;  Location: [facility] OR;  Service: Orthopedics;  Laterality: N/A;  . biopsy of toe Right   . BLADDER SUSPENSION    . PERIPHERALLY INSERTED CENTRAL CATHETER INSERTION  11/2013  . TEMPOROMANDIBULAR JOINT ARTHROPLASTY Bilateral   . TMJ ARTHROPLASTY  1988  . TOE DEBRIDEMENT Right   . VAGINAL HYSTERECTOMY      There were no vitals filed for this visit.   Subjective Assessment - 02/06/19 1529    Subjective  Pt is a 64 y/o female who presents to OPPT for chronic Rt shoulder pain.  MRI showed two tears of RTC.  Pt also with recent C5-6 ACDF, which helped pain initially, but then as she became more active pain returned but differently.  Pt now radiating further down the Rt side of thoracic, and occasionally into bicep.    Pertinent History  fibromyalgia, osteomyelitis of toe, C5-6 ACDF (Sept 2020), TMJ arthroplasty    Diagnostic tests  MRI: 1. Severe tendinosis of the  supraspinatus tendon with apartial-thickness articular surface tear and a small full-thicknesstear of the anterior fibers.2. Moderate tendinosis of the infraspinatus tendon.3. Moderate tendinosis of the intra-articular portion of the longhead of the biceps tendon.    Patient Stated Goals  improve pain    Currently in Pain?  Yes    Pain Score  6    up to 10/10; at best 0/10   Pain Location  Shoulder    Pain Orientation  Right    Pain Type  Acute pain;Chronic pain    Pain Radiating Towards  Rt side along ribs    Pain Onset  More than a month ago    Pain Frequency  Intermittent    Aggravating Factors   forward head/rounded shoulder posture    Pain Relieving Factors  avoiding provoking position, heat, aleve, occasional muscle relaxer         [facility] PT Assessment - 02/06/19 1535      Assessment   Medical Diagnosis  M25.511 (ICD-10-CM) - Right shoulder pain, unspecified chronicity    Referring Provider (PT)  Cammy Copa, MD    Onset Date/Surgical Date  --   8-10 years; with acute exacerbation 1-2 years   Hand Dominance  Right    Next MD Visit  PRN    Prior Therapy  none recently      Precautions  Precautions  None      Restrictions   Weight Bearing Restrictions  No      Balance Screen   Has the patient fallen in the past 6 months  No    Has the patient had a decrease in activity level because of a fear of falling?   No    Is the patient reluctant to leave their home because of a fear of falling?   No      Home Public house manager residence    Living Arrangements  Children   2 sons   Additional Comments  no difficulty with ADLs      Prior Function   Level of Independence  Independent    Vocation  Retired    Gaffer  retired from [employer]    Leisure  yardwork; computer games; occasional reading; was exercising regularly - hasn't done anything since surgery/mid-March      Cognition   Overall Cognitive Status  Within  Functional Limits for tasks assessed      Posture/Postural Control   Posture/Postural Control  Postural limitations    Postural Limitations  Rounded Shoulders;Forward head      ROM / Strength   AROM / PROM / Strength  AROM;Strength      AROM   Overall AROM Comments  bil shoulders WNL    AROM Assessment Site  Shoulder    Right/Left Shoulder  --      Strength   Strength Assessment Site  Shoulder    Right/Left Shoulder  Right;Left    Right Shoulder Flexion  3+/5    Right Shoulder ABduction  3+/5    Right Shoulder Internal Rotation  5/5    Right Shoulder External Rotation  4/5    Left Shoulder Flexion  4/5    Left Shoulder ABduction  4/5    Left Shoulder Internal Rotation  5/5    Left Shoulder External Rotation  5/5      Palpation   Spinal mobility  hypomobile T4-T10 CPA mobs    Palpation comment  trigger points in Rt levator scapula, rhomboids, subscap                Objective measurements completed on examination: See above findings.      [facility] Adult PT Treatment/Exercise - 02/06/19 1535      Self-Care   Self-Care  Other Self-Care Comments    Other Self-Care Comments   instructed in thoracic extension over physioball, use of tennis ball for mobilization, reverse flys, shoulder extension and bent over rows for home HEP.      Exercises   Exercises  Neck      Manual Therapy   Manual Therapy  Soft tissue mobilization    Manual therapy comments  skilled monitoring and palpation of soft tissue during DN    Soft tissue mobilization  Rt rhomboids; infraspinatus and teres minor      Neck Exercises: Stretches   Other Neck Stretches  mid doorway stretch x 30 sec       Trigger Point Dry Needling - 02/07/19 0740    Consent Given?  Yes    Education Handout Provided  Yes    Muscles Treated Upper Quadrant  Rhomboids;Infraspinatus;Teres minor    Rhomboids Response  Twitch response elicited;Palpable increased muscle length    Infraspinatus Response  Twitch response  elicited;Palpable increased muscle length    Teres minor Response  Twitch response elicited;Palpable increased muscle length  PT Education - 02/06/19 1621    Education Details  HEP, DN    Person(s) Educated  Patient    Methods  Explanation;Demonstration;Handout    Comprehension  Verbalized understanding;Returned demonstration;Need further instruction          PT Long Term Goals - 02/07/19 0745      PT LONG TERM GOAL #1   Title  independent with HEP    Status  New    Target Date  03/21/19      PT LONG TERM GOAL #2   Title  report pain <4/10 with activity for improved function    Status  New    Target Date  03/21/19      PT LONG TERM GOAL #3   Title  demonstrate at least 4/5 strength in Rt shoulder for improved function    Status  New    Target Date  03/21/19             Plan - 02/07/19 0742    Clinical Impression Statement  Pt is a 64 y/o female who presents to OPPT for chronic Rt shoulder pain.  Pt demonstrates decreased strength and postural abnormalities, as well as active trigger points with referred pain which all affect function.  Pt will benefit from PT to address deficits.    Personal Factors and Comorbidities  Comorbidity 3+    Comorbidities  fibromyalgia, osteomyelitis of toe, C5-6 ACDF (Sept 2020), TMJ arthroplasty    Examination-Activity Limitations  Bathing;Sleep;Carry;Lift;Hygiene/Grooming;Dressing;Reach Overhead;Toileting    Examination-Participation Restrictions  Administrator, sports   community fitness   Stability/Clinical Decision Making  Evolving/Moderate complexity    Clinical Decision Making  Moderate    Rehab Potential  Good    PT Frequency  1x / week    PT Duration  6 weeks    PT Treatment/Interventions  ADLs/Self Care Home Management;Cryotherapy;Electrical Stimulation;Moist Heat;Therapeutic exercise;Therapeutic activities;Ultrasound;Neuromuscular re-education;Patient/family education;Manual techniques;Taping    PT Next Visit Plan   review exercises and progress strengthening, assess response to DN; continue manual/modalities/DN PRN for pain    PT Home Exercise Plan  Access Code: [email or web address]    Consulted and Agree with Plan of Care  Patient       Patient will benefit from skilled therapeutic intervention in order to improve the following deficits and impairments:  Increased fascial restricitons, Increased muscle spasms, Pain, Impaired UE functional use, Decreased strength, Postural dysfunction  Visit Diagnosis: Chronic right shoulder pain - Plan: PT plan of care cert/re-cert  Abnormal posture - Plan: PT plan of care cert/re-cert  Muscle weakness (generalized) - Plan: PT plan of care cert/re-cert  Other symptoms and signs involving the musculoskeletal system - Plan: PT plan of care cert/re-cert     Problem List Patient Active Problem List   Diagnosis Date Noted  . S/P cervical spinal fusion 01/08/2019  . Other spondylosis with radiculopathy, cervical region   . 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 Enteritis due to Clostridium difficile   . Osteomyelitis ([place]) 12/10/2013  . Fever 12/10/2013  . Sepsis ([place]) 12/10/2013  . Obstructive sleep apnea 12/10/2013  . Acute osteomyelitis of ankle and foot ([place]) 11/06/2013      Laureen Abrahams, PT, DPT 02/07/19 7:49 AM     [facility] Physical Therapy [address] [place], [place], [address] Phone: [phone]   Fax:  [phone]  Name: Carrie [name] MRN: [id number] Date of Birth: September 18, 1955

## 2019-02-09 ENCOUNTER — Encounter: Payer: Self-pay | Admitting: Orthopedic Surgery

## 2019-02-09 NOTE — Progress Notes (Signed)
Office Visit Note   Patient: Carrie [name]           Date of Birth: [birth year]           MRN: [id number] Visit Date: 02/03/2019 Requested by: Hadley Pen, MD [address] [place],  [place] [address] PCP: Hadley Pen, MD  Subjective: Chief Complaint  Patient presents with  . Right Shoulder - Pain    HPI: Carrie [name] is a patient with right shoulder pain.  She had recent cervical fusion 10/14/2018.  Doing well from that.  But she now has continued pain in the right shoulder.  This is a different type of pain.  She reports some tightness in the posterior shoulder.  Robaxin has not given her a lot of relief.  MRI scan from 2019 shows supraspinatus tendinosis plus a small full-thickness tear along with biceps tendon tendinosis.  She does localize some pain and tenderness to the biceps region.  She has been to the gym in the year.  She does do a lot of yard work.  Localizes a lot of pain in her trapezial region superiorly.  She also has some positional pain but no pain radiating below the elbow.  She wants to avoid surgical intervention if possible.              ROS: All systems reviewed are negative as they relate to the chief complaint within the history of present illness.  Patient denies  fevers or chills.   Assessment & Plan: Visit Diagnoses:  1. Right shoulder pain, unspecified chronicity     Plan: Impression is right shoulder supraspinatus tendinosis and small tear.  Plan is physical therapy for range of motion strengthening and home exercise program 1-2 times a week for 1 to 2 weeks.  We will check her back in 6 weeks for clinical recheck.  She wants to try to avoid surgery if possible.  I think with this size tear and based on her examination today that we can wait this 1 out at least a little while longer.  Follow-up in 6 weeks for clinical recheck.  Follow-Up Instructions: No follow-ups on file.   Orders:  Orders Placed This Encounter  Procedures  . Ambulatory  referral to Physical Therapy   No orders of the defined types were placed in this encounter.     Procedures: No procedures performed   Clinical Data: No additional findings.  Objective: Vital Signs: Ht 5\' 3"  (1.6 m)   Wt 130 lb (59 kg)   BMI 23.03 kg/m   Physical Exam:   Constitutional: Patient appears well-developed HEENT:  Head: Normocephalic Eyes:EOM are normal Neck: Normal range of motion Cardiovascular: Normal rate Pulmonary/chest: Effort normal Neurologic: Patient is alert Skin: Skin is warm Psychiatric: Patient has normal mood and affect    Ortho Exam: Ortho exam demonstrates good passive range of motion of the right shoulder with no restriction of external rotation 15 degrees of abduction.  Motor sensory function hand is intact.  Radial pulses intact.  No masses lymphadenopathy or skin changes noted in that shoulder girdle region.  She has pretty good strength demonstrated supraspinatus and subscap muscle testing.  Specialty Comments:  No specialty comments available.  Imaging: No results found.   PMFS History: Patient Active Problem List   Diagnosis Date Noted  . S/P cervical spinal fusion 01/08/2019  . Other spondylosis with radiculopathy, cervical region   . 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 Enteritis due to Clostridium difficile   .  Osteomyelitis ([place]) 12/10/2013  . Fever 12/10/2013  . Sepsis ([place]) 12/10/2013  . Obstructive sleep apnea 12/10/2013  . Acute osteomyelitis of ankle and foot ([place]) 11/06/2013   Past Medical History:  Diagnosis Date  . Clostridium difficile diarrhea   . Fibromyalgia   . OSA on CPAP   . Osteomyelitis of toe ([place])     History reviewed. No pertinent family history.  Past Surgical History:  Procedure Laterality Date  . ANTERIOR CERVICAL DECOMP/DISCECTOMY FUSION N/A 10/14/2018   Procedure: CERVICAL FIVE-  CERVICAL SIX ANTERIOR CERVICAL DECOMPRESSION/DISCECTOMY FUSION, ALLOGRAFT PLATE;  Surgeon:  Marybelle Killings, MD;  Location: [place];  Service: Orthopedics;  Laterality: N/A;  . biopsy of toe Right   . BLADDER SUSPENSION    . PERIPHERALLY INSERTED CENTRAL CATHETER INSERTION  11/2013  . TEMPOROMANDIBULAR JOINT ARTHROPLASTY Bilateral   . TMJ ARTHROPLASTY  1988  . TOE DEBRIDEMENT Right   . VAGINAL HYSTERECTOMY     Social History   Occupational History  . Not on file  Tobacco Use  . Smoking status: Never Smoker  . Smokeless tobacco: Never Used  Substance and Sexual Activity  . Alcohol use: No  . Drug use: No  . Sexual activity: Never

## 2019-02-21 ENCOUNTER — Other Ambulatory Visit: Payer: Self-pay

## 2019-02-21 ENCOUNTER — Encounter: Payer: Self-pay | Admitting: Physical Therapy

## 2019-02-21 ENCOUNTER — Ambulatory Visit: Payer: BC Managed Care – PPO | Admitting: Physical Therapy

## 2019-02-21 DIAGNOSIS — M25511 Pain in right shoulder: Secondary | ICD-10-CM | POA: Diagnosis not present

## 2019-02-21 DIAGNOSIS — R29898 Other symptoms and signs involving the musculoskeletal system: Secondary | ICD-10-CM | POA: Diagnosis not present

## 2019-02-21 DIAGNOSIS — M6281 Muscle weakness (generalized): Secondary | ICD-10-CM

## 2019-02-21 DIAGNOSIS — R293 Abnormal posture: Secondary | ICD-10-CM | POA: Diagnosis not present

## 2019-02-21 DIAGNOSIS — G8929 Other chronic pain: Secondary | ICD-10-CM

## 2019-02-21 NOTE — Therapy (Signed)
[facility] Physical Therapy [address] [place], [place], [address] Phone: [phone]   Fax:  [phone]  Physical Therapy Treatment  Patient Details  Name: Carrie [name] MRN: [id number] Date of Birth: [birth year] Referring Provider (PT): Marlou Sa Tonna Corner, MD   Encounter Date: 02/21/2019  PT End of Session - 02/21/19 1023    Visit Number  2    Number of Visits  6    Date for PT Re-Evaluation  03/20/19    PT Start Time  0935    PT Stop Time  1013    PT Time Calculation (min)  38 min    Activity Tolerance  Patient tolerated treatment well    Behavior During Therapy  [facility] for tasks assessed/performed       Past Medical History:  Diagnosis Date  . Clostridium difficile diarrhea   . Fibromyalgia   . OSA on CPAP   . Osteomyelitis of toe [facility])     Past Surgical History:  Procedure Laterality Date  . ANTERIOR CERVICAL DECOMP/DISCECTOMY FUSION N/A 10/14/2018   Procedure: CERVICAL FIVE-  CERVICAL SIX ANTERIOR CERVICAL DECOMPRESSION/DISCECTOMY FUSION, ALLOGRAFT PLATE;  Surgeon: Marybelle Killings, MD;  Location: [place];  Service: Orthopedics;  Laterality: N/A;  . biopsy of toe Right   . BLADDER SUSPENSION    . PERIPHERALLY INSERTED CENTRAL CATHETER INSERTION  11/2013  . TEMPOROMANDIBULAR JOINT ARTHROPLASTY Bilateral   . TMJ ARTHROPLASTY  1988  . TOE DEBRIDEMENT Right   . VAGINAL HYSTERECTOMY      There were no vitals filed for this visit.  Subjective Assessment - 02/21/19 0936    Subjective  feeling much better, pain improved overall.  feels therapy and stretching have helped a lot.    Pertinent History  fibromyalgia, osteomyelitis of toe, C5-6 ACDF (Sept 2020), TMJ arthroplasty    Diagnostic tests  MRI: 1. Severe tendinosis of the supraspinatus tendon with apartial-thickness articular surface tear and a small full-thicknesstear of the anterior fibers.2. Moderate tendinosis of the infraspinatus tendon.3. Moderate tendinosis of the intra-articular portion of the  longhead of the biceps tendon.    Patient Stated Goals  improve pain    Pain Score  1    "half"   Pain Location  Shoulder    Pain Orientation  Right    Pain Onset  More than a month ago    Pain Frequency  Intermittent                       [facility] Adult PT Treatment/Exercise - 02/21/19 0939      Neck Exercises: Machines for Strengthening   UBE (Upper Arm Bike)  L2 x 5 min (2.5' each direction)      Manual Therapy   Manual Therapy  Soft tissue mobilization    Manual therapy comments  skilled monitoring and palpation of soft tissue during DN    Soft tissue mobilization  Rt rhomboids, upper trap and levator scapula      Neck Exercises: Stretches   Other Neck Stretches  mid doorway stretch 3 x 30 sec       Trigger Point Dry Needling - 02/21/19 1022    Consent Given?  Yes    Education Handout Provided  Previously provided    Muscles Treated Head and Neck  Upper trapezius;Levator scapulae    Muscles Treated Upper Quadrant  Rhomboids    Electrical Stimulation Performed with Dry Needling  Yes    E-stim with Dry Needling Details  to Rt rhomboids 1  mHz freq to tolerance x 6 min    Upper Trapezius Response  Twitch reponse elicited;Palpable increased muscle length    Levator Scapulae Response  Twitch response elicited;Palpable increased muscle length    Rhomboids Response  Twitch response elicited;Palpable increased muscle length                PT Long Term Goals - 02/21/19 1024      PT LONG TERM GOAL #1   Title  independent with HEP    Status  On-going    Target Date  03/21/19      PT LONG TERM GOAL #2   Title  report pain <4/10 with activity for improved function    Status  Achieved      PT LONG TERM GOAL #3   Title  demonstrate at least 4/5 strength in Rt shoulder for improved function    Status  On-going    Target Date  03/21/19            Plan - 02/21/19 1024    Clinical Impression Statement  Pt reports improvement in pain and symptoms  with consistent exercise and reports some minimal pain still in Rt rhomboids and upper trap/levator scapula area.  Pt has met LTG #2 with decreased pain overall.  Will plan to see PRN.    Personal Factors and Comorbidities  Comorbidity 3+    Comorbidities  fibromyalgia, osteomyelitis of toe, C5-6 ACDF (Sept 2020), TMJ arthroplasty    Examination-Activity Limitations  Bathing;Sleep;Carry;Lift;Hygiene/Grooming;Dressing;Reach Overhead;Toileting    Examination-Participation Restrictions  Administrator, sports   community fitness   Stability/Clinical Decision Making  Evolving/Moderate complexity    Rehab Potential  Good    PT Frequency  1x / week    PT Duration  6 weeks    PT Treatment/Interventions  ADLs/Self Care Home Management;Cryotherapy;Electrical Stimulation;Moist Heat;Therapeutic exercise;Therapeutic activities;Ultrasound;Neuromuscular re-education;Patient/family education;Manual techniques;Taping    PT Next Visit Plan  review exercises and progress strengthening, assess response to DN and estim; continue manual/modalities/DN PRN for pain    PT Home Exercise Plan  Access Code: [email or web address]    Consulted and Agree with Plan of Care  Patient       Patient will benefit from skilled therapeutic intervention in order to improve the following deficits and impairments:  Increased fascial restricitons, Increased muscle spasms, Pain, Impaired UE functional use, Decreased strength, Postural dysfunction  Visit Diagnosis: Chronic right shoulder pain  Abnormal posture  Muscle weakness (generalized)  Other symptoms and signs involving the musculoskeletal system     Problem List Patient Active Problem List   Diagnosis Date Noted  . S/P cervical spinal fusion 01/08/2019  . Other spondylosis with radiculopathy, cervical region   . 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 Enteritis due to Clostridium difficile   . Osteomyelitis ([place]) 12/10/2013  . Fever 12/10/2013  . Sepsis ([place])  12/10/2013  . Obstructive sleep apnea 12/10/2013  . Acute osteomyelitis of ankle and foot ([place]) 11/06/2013      Laureen Abrahams, PT, DPT 02/21/19 10:27 AM     [facility] Physical Therapy [address] [place], [place], [address] Phone: [phone]   Fax:  [phone]  Name: Carrie [name] MRN: [id number] Date of Birth: [birth year]

## 2019-03-05 ENCOUNTER — Ambulatory Visit (INDEPENDENT_AMBULATORY_CARE_PROVIDER_SITE_OTHER): Payer: BC Managed Care – PPO | Admitting: Physical Therapy

## 2019-03-05 ENCOUNTER — Encounter: Payer: Self-pay | Admitting: Physical Therapy

## 2019-03-05 ENCOUNTER — Other Ambulatory Visit: Payer: Self-pay

## 2019-03-05 DIAGNOSIS — M6281 Muscle weakness (generalized): Secondary | ICD-10-CM

## 2019-03-05 DIAGNOSIS — R293 Abnormal posture: Secondary | ICD-10-CM | POA: Diagnosis not present

## 2019-03-05 DIAGNOSIS — R29898 Other symptoms and signs involving the musculoskeletal system: Secondary | ICD-10-CM | POA: Diagnosis not present

## 2019-03-05 DIAGNOSIS — M25511 Pain in right shoulder: Secondary | ICD-10-CM

## 2019-03-05 DIAGNOSIS — G8929 Other chronic pain: Secondary | ICD-10-CM

## 2019-03-05 NOTE — Therapy (Signed)
[facility] [address] [place], [place], [address] Phone: [phone]   Fax:  [phone]  Physical Therapy Treatment  Patient Details  Name: Carrie [name] MRN: [id number] Date of Birth: Sep 25, 1955 Referring Provider (PT): Marlou Sa Tonna Corner, MD   Encounter Date: 03/05/2019  PT End of Session - 03/05/19 1204    Visit Number  3    Number of Visits  6    Date for PT Re-Evaluation  03/20/19    PT Start Time  0935    PT Stop Time  1005    PT Time Calculation (min)  30 min    Activity Tolerance  Patient tolerated treatment well    Behavior During Therapy  [facility] for tasks assessed/performed       Past Medical History:  Diagnosis Date  . Clostridium difficile diarrhea   . Fibromyalgia   . OSA on CPAP   . Osteomyelitis of toe [facility])     Past Surgical History:  Procedure Laterality Date  . ANTERIOR CERVICAL DECOMP/DISCECTOMY FUSION N/A 10/14/2018   Procedure: CERVICAL FIVE-  CERVICAL SIX ANTERIOR CERVICAL DECOMPRESSION/DISCECTOMY FUSION, ALLOGRAFT PLATE;  Surgeon: Marybelle Killings, MD;  Location: [place];  Service: Orthopedics;  Laterality: N/A;  . biopsy of toe Right   . BLADDER SUSPENSION    . PERIPHERALLY INSERTED CENTRAL CATHETER INSERTION  11/2013  . TEMPOROMANDIBULAR JOINT ARTHROPLASTY Bilateral   . TMJ ARTHROPLASTY  1988  . TOE DEBRIDEMENT Right   . VAGINAL HYSTERECTOMY      There were no vitals filed for this visit.  Subjective Assessment - 03/05/19 0938    Subjective  feeling better, "it's not my shoulder that's bothering me, it's down here" (pointing to mid back)    Pertinent History  fibromyalgia, osteomyelitis of toe, C5-6 ACDF (Sept 2020), TMJ arthroplasty    Diagnostic tests  MRI: 1. Severe tendinosis of the supraspinatus tendon with apartial-thickness articular surface tear and a small full-thicknesstear of the anterior fibers.2. Moderate tendinosis of the infraspinatus tendon.3. Moderate tendinosis of the intra-articular portion  of the longhead of the biceps tendon.    Patient Stated Goals  improve pain    Currently in Pain?  Yes    Pain Location  Back    Pain Orientation  Mid;Upper    Pain Descriptors / Indicators  Aching    Pain Type  Acute pain;Chronic pain    Pain Onset  More than a month ago    Aggravating Factors   posture/positioning    Pain Relieving Factors  sitting up better, muscle relaxer         [facility] PT Assessment - 03/05/19 1201      Assessment   Medical Diagnosis  M25.511 (ICD-10-CM) - Right shoulder pain, unspecified chronicity    Referring Provider (PT)  Meredith Pel, MD      Strength   Right Shoulder Flexion  4/5    Right Shoulder ABduction  4+/5    Right Shoulder Internal Rotation  5/5    Right Shoulder External Rotation  4+/5                   [facility] Adult PT Treatment/Exercise - 03/05/19 0939      Neck Exercises: Machines for Strengthening   UBE (Upper Arm Bike)  L5.5 x 5 min (2.5' each direction)      Manual Therapy   Manual Therapy  Soft tissue mobilization    Manual therapy comments  skilled monitoring and palpation of soft tissue during DN  Soft tissue mobilization  Rt rhomboids, upper trap and levator scapula       Trigger Point Dry Needling - 03/05/19 1201    Consent Given?  Yes    Education Handout Provided  Previously provided    Muscles Treated Upper Quadrant  Rhomboids;Subscapularis;Middle trapezius    Rhomboids Response  Twitch response elicited;Palpable increased muscle length    Subscapularis Response  Twitch response elicited;Palpable increased muscle length    Middle trapezius Response  Twitch response elicited;Palpable increased muscle length                PT Long Term Goals - 03/05/19 1204      PT LONG TERM GOAL #1   Title  independent with HEP    Status  On-going    Target Date  03/21/19      PT LONG TERM GOAL #2   Title  report pain <4/10 with activity for improved function    Status  Achieved      PT LONG TERM  GOAL #3   Title  demonstrate at least 4/5 strength in Rt shoulder for improved function    Status  Achieved            Plan - 03/05/19 1204    Clinical Impression Statement  Pt has met LTG #3 at this time, and is independent with current HEP.  Overall doing well and may be ready for d/c today, but one more appt scheduled in case pt needs.    Personal Factors and Comorbidities  Comorbidity 3+    Comorbidities  fibromyalgia, osteomyelitis of toe, C5-6 ACDF (Sept 2020), TMJ arthroplasty    Examination-Activity Limitations  Bathing;Sleep;Carry;Lift;Hygiene/Grooming;Dressing;Reach Overhead;Toileting    Examination-Participation Restrictions  Administrator, sports   community fitness   Stability/Clinical Decision Making  Evolving/Moderate complexity    Rehab Potential  Good    PT Frequency  1x / week    PT Duration  6 weeks    PT Treatment/Interventions  ADLs/Self Care Home Management;Cryotherapy;Electrical Stimulation;Moist Heat;Therapeutic exercise;Therapeutic activities;Ultrasound;Neuromuscular re-education;Patient/family education;Manual techniques;Taping    PT Next Visit Plan  continue PRN, consider d/c if doing well    PT Home Exercise Plan  Access Code: [email or web address]    Consulted and Agree with Plan of Care  Patient       Patient will benefit from skilled therapeutic intervention in order to improve the following deficits and impairments:  Increased fascial restricitons, Increased muscle spasms, Pain, Impaired UE functional use, Decreased strength, Postural dysfunction  Visit Diagnosis: Chronic right shoulder pain  Abnormal posture  Muscle weakness (generalized)  Other symptoms and signs involving the musculoskeletal system     Problem List Patient Active Problem List   Diagnosis Date Noted  . S/P cervical spinal fusion 01/08/2019  . Other spondylosis with radiculopathy, cervical region   . 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 Enteritis due to Clostridium  difficile   . Osteomyelitis ([place]) 12/10/2013  . Fever 12/10/2013  . Sepsis ([place]) 12/10/2013  . Obstructive sleep apnea 12/10/2013  . Acute osteomyelitis of ankle and foot ([place]) 11/06/2013        Laureen Abrahams, PT, DPT 03/05/19 12:15 PM       [facility] [address] [place], [place], [address] Phone: [phone]   Fax:  [phone]  Name: Carrie [name] MRN: [id number] Date of Birth: [birth year]

## 2019-03-18 ENCOUNTER — Encounter: Payer: Self-pay | Admitting: Physical Therapy

## 2019-03-18 ENCOUNTER — Ambulatory Visit (INDEPENDENT_AMBULATORY_CARE_PROVIDER_SITE_OTHER): Payer: BC Managed Care – PPO | Admitting: Physical Therapy

## 2019-03-18 ENCOUNTER — Other Ambulatory Visit: Payer: Self-pay

## 2019-03-18 DIAGNOSIS — R293 Abnormal posture: Secondary | ICD-10-CM

## 2019-03-18 DIAGNOSIS — R29898 Other symptoms and signs involving the musculoskeletal system: Secondary | ICD-10-CM

## 2019-03-18 DIAGNOSIS — M25511 Pain in right shoulder: Secondary | ICD-10-CM | POA: Diagnosis not present

## 2019-03-18 DIAGNOSIS — G8929 Other chronic pain: Secondary | ICD-10-CM

## 2019-03-18 DIAGNOSIS — M6281 Muscle weakness (generalized): Secondary | ICD-10-CM

## 2019-03-18 NOTE — Therapy (Signed)
[facility] [address] [place], [place], [address] Phone: [phone]   Fax:  [phone]  Physical Therapy Treatment  Patient Details  Name: Alessandria Henken MRN: [id number] Date of Birth: 08/21/1955 Referring Provider (PT): Marlou Sa Tonna Corner, MD   Encounter Date: 03/18/2019  PT End of Session - 03/18/19 1558    Visit Number  4    Number of Visits  6    Date for PT Re-Evaluation  03/20/19    PT Start Time  1455    PT Stop Time  1540    PT Time Calculation (min)  45 min    Activity Tolerance  Patient tolerated treatment well    Behavior During Therapy  [facility] for tasks assessed/performed       Past Medical History:  Diagnosis Date  . Clostridium difficile diarrhea   . Fibromyalgia   . OSA on CPAP   . Osteomyelitis of toe [facility])     Past Surgical History:  Procedure Laterality Date  . ANTERIOR CERVICAL DECOMP/DISCECTOMY FUSION N/A 10/14/2018   Procedure: CERVICAL FIVE-  CERVICAL SIX ANTERIOR CERVICAL DECOMPRESSION/DISCECTOMY FUSION, ALLOGRAFT PLATE;  Surgeon: Marybelle Killings, MD;  Location: [facility];  Service: Orthopedics;  Laterality: N/A;  . biopsy of toe Right   . BLADDER SUSPENSION    . PERIPHERALLY INSERTED CENTRAL CATHETER INSERTION  11/2013  . TEMPOROMANDIBULAR JOINT ARTHROPLASTY Bilateral   . TMJ ARTHROPLASTY  1988  . TOE DEBRIDEMENT Right   . VAGINAL HYSTERECTOMY      There were no vitals filed for this visit.  Subjective Assessment - 03/18/19 1458    Subjective  went to gym last week which was fine; then yesterday went and did some lower body exercises but today having increased pain in mid back.    Pertinent History  fibromyalgia, osteomyelitis of toe, C5-6 ACDF (Sept 2020), TMJ arthroplasty    Diagnostic tests  MRI: 1. Severe tendinosis of the supraspinatus tendon with apartial-thickness articular surface tear and a small full-thicknesstear of the anterior fibers.2. Moderate tendinosis of the infraspinatus tendon.3. Moderate  tendinosis of the intra-articular portion of the longhead of the biceps tendon.    Patient Stated Goals  improve pain    Currently in Pain?  Yes    Pain Score  4     Pain Location  Back    Pain Orientation  Mid    Pain Descriptors / Indicators  Aching;Dull    Pain Type  Acute pain;Chronic pain    Pain Onset  More than a month ago    Pain Frequency  Intermittent    Aggravating Factors   unknown    Pain Relieving Factors  heat                       [facility] Adult PT Treatment/Exercise - 03/18/19 1500      Neck Exercises: Machines for Strengthening   UBE (Upper Arm Bike)  L4 x 4 min (2' each direction)      Neck Exercises: Supine   Neck Retraction  5 reps;5 secs      Neck Exercises: Prone   Other Prone Exercise  modifications for standing single limb hip extension; performed prone hip extension with alternating UEs x 5-10 reps each      Manual Therapy   Manual Therapy  Soft tissue mobilization    Manual therapy comments  skilled monitoring and palpation of soft tissue during DN    Soft tissue mobilization  Rt rhomboids, upper trap  and levator scapula      Neck Exercises: Stretches   Other Neck Stretches  seated ball roll cat/cow for thoracic mobility       Trigger Point Dry Needling - 03/18/19 1558    Consent Given?  Yes    Education Handout Provided  Previously provided    Muscles Treated Head and Neck  Upper trapezius    Muscles Treated Upper Quadrant  Rhomboids    Upper Trapezius Response  Twitch reponse elicited;Palpable increased muscle length    Rhomboids Response  Twitch response elicited;Palpable increased muscle length                PT Long Term Goals - 03/05/19 1204      PT LONG TERM GOAL #1   Title  independent with HEP    Status  On-going    Target Date  03/21/19      PT LONG TERM GOAL #2   Title  report pain <4/10 with activity for improved function    Status  Achieved      PT LONG TERM GOAL #3   Title  demonstrate at least 4/5  strength in Rt shoulder for improved function    Status  Achieved            Plan - 03/18/19 1559    Clinical Impression Statement  Pt returned today with increase in pain with resuming lower body workouts.  Instructed in modifications to exercises to decrease risk of injury.  Plan to see 1-2 more sessions to ensure safe return to exercise.    Personal Factors and Comorbidities  Comorbidity 3+    Comorbidities  fibromyalgia, osteomyelitis of toe, C5-6 ACDF (Sept 2020), TMJ arthroplasty    Examination-Activity Limitations  Bathing;Sleep;Carry;Lift;Hygiene/Grooming;Dressing;Reach Overhead;Toileting    Examination-Participation Restrictions  Production assistant, radio   community fitness   Stability/Clinical Decision Making  Evolving/Moderate complexity    Rehab Potential  Good    PT Frequency  1x / week    PT Duration  6 weeks    PT Treatment/Interventions  ADLs/Self Care Home Management;Cryotherapy;Electrical Stimulation;Moist Heat;Therapeutic exercise;Therapeutic activities;Ultrasound;Neuromuscular re-education;Patient/family education;Manual techniques;Taping    PT Next Visit Plan  see how's she's doing after DN and this session, consider hold or d/c    PT Home Exercise Plan  Access Code: [email or web address]    Consulted and Agree with Plan of Care  Patient       Patient will benefit from skilled therapeutic intervention in order to improve the following deficits and impairments:  Increased fascial restricitons, Increased muscle spasms, Pain, Impaired UE functional use, Decreased strength, Postural dysfunction  Visit Diagnosis: Chronic right shoulder pain  Abnormal posture  Muscle weakness (generalized)  Other symptoms and signs involving the musculoskeletal system     Problem List Patient Active Problem List   Diagnosis Date Noted  . S/P cervical spinal fusion 01/08/2019  . Other spondylosis with radiculopathy, cervical region   . 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  Enteritis due to Clostridium difficile   . Osteomyelitis ([facility]) 12/10/2013  . Fever 12/10/2013  . Sepsis ([facility]) 12/10/2013  . Obstructive sleep apnea 12/10/2013  . Acute osteomyelitis of ankle and foot ([facility]) 11/06/2013      Clarita Crane, PT, DPT 03/18/19 4:02 PM     [facility] [address] [place], [place], [address] Phone: [phone]   Fax:  [phone]  Name: Miroslava Santellan MRN: [id number] Date of Birth: May 28, 1955

## 2019-03-28 ENCOUNTER — Ambulatory Visit: Payer: BC Managed Care – PPO | Admitting: Physical Therapy

## 2019-03-28 ENCOUNTER — Encounter: Payer: Self-pay | Admitting: Physical Therapy

## 2019-03-28 ENCOUNTER — Other Ambulatory Visit: Payer: Self-pay

## 2019-03-28 DIAGNOSIS — M6281 Muscle weakness (generalized): Secondary | ICD-10-CM | POA: Diagnosis not present

## 2019-03-28 DIAGNOSIS — R29898 Other symptoms and signs involving the musculoskeletal system: Secondary | ICD-10-CM

## 2019-03-28 DIAGNOSIS — M25511 Pain in right shoulder: Secondary | ICD-10-CM | POA: Diagnosis not present

## 2019-03-28 DIAGNOSIS — G8929 Other chronic pain: Secondary | ICD-10-CM

## 2019-03-28 DIAGNOSIS — R293 Abnormal posture: Secondary | ICD-10-CM | POA: Diagnosis not present

## 2019-03-28 NOTE — Therapy (Addendum)
[facility] [address] [place], [place], [address] Phone: [phone]   Fax:  [phone]  Physical Therapy Treatment/Recertification/Discharge Summary  Patient Details  Name: Shaeleigh Graw MRN: [id number] Date of Birth: [birth year] Referring Provider (PT): Marlou Sa Tonna Corner, MD   Encounter Date: 03/28/2019  PT End of Session - 03/28/19 1008    Visit Number  5    Number of Visits  9    Date for PT Re-Evaluation  04/25/19    PT Start Time  0930    PT Stop Time  1000    PT Time Calculation (min)  30 min    Activity Tolerance  Patient tolerated treatment well    Behavior During Therapy  [facility] for tasks assessed/performed       Past Medical History:  Diagnosis Date  . Clostridium difficile diarrhea   . Fibromyalgia   . OSA on CPAP   . Osteomyelitis of toe [facility])     Past Surgical History:  Procedure Laterality Date  . ANTERIOR CERVICAL DECOMP/DISCECTOMY FUSION N/A 10/14/2018   Procedure: CERVICAL FIVE-  CERVICAL SIX ANTERIOR CERVICAL DECOMPRESSION/DISCECTOMY FUSION, ALLOGRAFT PLATE;  Surgeon: Marybelle Killings, MD;  Location: [facility];  Service: Orthopedics;  Laterality: N/A;  . biopsy of toe Right   . BLADDER SUSPENSION    . PERIPHERALLY INSERTED CENTRAL CATHETER INSERTION  11/2013  . TEMPOROMANDIBULAR JOINT ARTHROPLASTY Bilateral   . TMJ ARTHROPLASTY  1988  . TOE DEBRIDEMENT Right   . VAGINAL HYSTERECTOMY      There were no vitals filed for this visit.  Subjective Assessment - 03/28/19 0930    Subjective  "I need to discuss some discoveries."  had a knot "the size of my fist" in Rt upper trap which has now improved.  also having intense pain on Rt side abdomen with core work.    Pertinent History  fibromyalgia, osteomyelitis of toe, C5-6 ACDF (Sept 2020), TMJ arthroplasty    Diagnostic tests  MRI: 1. Severe tendinosis of the supraspinatus tendon with apartial-thickness articular surface tear and a small full-thicknesstear of the anterior  fibers.2. Moderate tendinosis of the infraspinatus tendon.3. Moderate tendinosis of the intra-articular portion of the longhead of the biceps tendon.    Patient Stated Goals  improve pain    Pain Onset  More than a month ago         [facility] PT Assessment - 03/28/19 1005      Assessment   Medical Diagnosis  M25.511 (ICD-10-CM) - Right shoulder pain, unspecified chronicity    Referring Provider (PT)  Marlou Sa Tonna Corner, MD      Strength   Overall Strength Comments  no pain with resisted Rt hip flexion; reproduction of pain with attempted crunch      Palpation   Palpation comment  no palpable buldge noted; no diastasis noted; increased trigger point Rt upper trap which pt reports is decreased in size                   [facility] Adult PT Treatment/Exercise - 03/28/19 1006      Self-Care   Self-Care  Other Self-Care Comments    Other Self-Care Comments   discussed concern for hernia, advised against exercises that reproduce pain and recommend ab set only for core work until seen by MD; recommend holding PT until after she sees MD to follow up for possible hernia             PT Education - 03/28/19 1007    Education  Details  see self care    Person(s) Educated  Patient    Methods  Explanation    Comprehension  Verbalized understanding          PT Long Term Goals - 03/28/19 1008      PT LONG TERM GOAL #1   Title  independent with HEP    Status  On-going    Target Date  04/25/19      PT LONG TERM GOAL #2   Title  report pain <4/10 with activity for improved function    Status  Achieved      PT LONG TERM GOAL #3   Title  demonstrate at least 4/5 strength in Rt shoulder for improved function    Status  Achieved            Plan - 03/28/19 1008    Clinical Impression Statement  Pt reports increased Rt side abdominal pain concerning for possible hernia.  Recommend she follow up with MD for further evaluation.  Pt in agreement.  Will hold PT until after  follow up.  Recert complete today for 1x/wk x 4 wks if pt returns.    Personal Factors and Comorbidities  Comorbidity 3+    Comorbidities  fibromyalgia, osteomyelitis of toe, C5-6 ACDF (Sept 2020), TMJ arthroplasty    Examination-Activity Limitations  Bathing;Sleep;Carry;Lift;Hygiene/Grooming;Dressing;Reach Overhead;Toileting    Examination-Participation Restrictions  Administrator, sports   community fitness   Stability/Clinical Decision Making  Evolving/Moderate complexity    Rehab Potential  Good    PT Frequency  1x / week    PT Duration  4 weeks    PT Treatment/Interventions  ADLs/Self Care Home Management;Cryotherapy;Electrical Stimulation;Moist Heat;Therapeutic exercise;Therapeutic activities;Ultrasound;Neuromuscular re-education;Patient/family education;Manual techniques;Taping    PT Next Visit Plan  follow up on MD visit, proceed as indicated    PT Home Exercise Plan  Access Code: [email or web address]    Consulted and Agree with Plan of Care  Patient       Patient will benefit from skilled therapeutic intervention in order to improve the following deficits and impairments:  Increased fascial restricitons, Increased muscle spasms, Pain, Impaired UE functional use, Decreased strength, Postural dysfunction  Visit Diagnosis: Chronic right shoulder pain - Plan: PT plan of care cert/re-cert  Abnormal posture - Plan: PT plan of care cert/re-cert  Muscle weakness (generalized) - Plan: PT plan of care cert/re-cert  Other symptoms and signs involving the musculoskeletal system - Plan: PT plan of care cert/re-cert     Problem List Patient Active Problem List   Diagnosis Date Noted  . S/P cervical spinal fusion 01/08/2019  . Other spondylosis with radiculopathy, cervical region   . 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 Enteritis due to Clostridium difficile   . Osteomyelitis ([place]) 12/10/2013  . Fever 12/10/2013  . Sepsis ([place]) 12/10/2013  . Obstructive sleep apnea 12/10/2013   . Acute osteomyelitis of ankle and foot ([place]) 11/06/2013     Laureen Abrahams, PT, DPT 03/28/19 10:13 AM     [facility] [address] [place], [place], [address] Phone: [phone]   Fax:  [phone]  Name: Rethel Sebek MRN: [id number] Date of Birth: [birth year]     PHYSICAL THERAPY DISCHARGE SUMMARY  Visits from Start of Care: 5  Current functional level related to goals / functional outcomes: See above   Remaining deficits: Unknown, pt did not return after surgical consult   Education / Equipment: HEP  Plan: Patient agrees to discharge.  Patient goals were partially met. Patient is  being discharged due to not returning since the last visit.  ?????    Laureen Abrahams, PT, DPT 06/24/19 9:47 AM  [facility] [address] [place], [place], [address] Phone: [phone]   Fax:  [phone]

## 2020-08-30 ENCOUNTER — Telehealth: Payer: Self-pay

## 2020-08-30 NOTE — Telephone Encounter (Signed)
Pt called stating she needs a new referral for her physical therapy for her neck.  Please advise.

## 2020-08-30 NOTE — Telephone Encounter (Signed)
Carrie [name] did  her cervical fusion.  I would ask him. thx

## 2020-08-31 ENCOUNTER — Other Ambulatory Visit: Payer: Self-pay

## 2020-08-31 ENCOUNTER — Encounter: Payer: Self-pay | Admitting: Physical Therapy

## 2020-08-31 DIAGNOSIS — M542 Cervicalgia: Secondary | ICD-10-CM

## 2020-08-31 DIAGNOSIS — Z981 Arthrodesis status: Secondary | ICD-10-CM

## 2020-08-31 DIAGNOSIS — M25511 Pain in right shoulder: Secondary | ICD-10-CM

## 2020-08-31 NOTE — Telephone Encounter (Signed)
Referral sent to PT Patient aware they will give her a call for an appt

## 2020-09-01 ENCOUNTER — Ambulatory Visit: Payer: Self-pay | Admitting: Physical Therapy

## 2020-09-01 ENCOUNTER — Encounter: Payer: Self-pay | Admitting: Physical Therapy

## 2020-09-01 ENCOUNTER — Other Ambulatory Visit: Payer: Self-pay

## 2020-09-01 DIAGNOSIS — R293 Abnormal posture: Secondary | ICD-10-CM

## 2020-09-01 DIAGNOSIS — M6281 Muscle weakness (generalized): Secondary | ICD-10-CM

## 2020-09-01 DIAGNOSIS — R29898 Other symptoms and signs involving the musculoskeletal system: Secondary | ICD-10-CM

## 2020-09-01 DIAGNOSIS — G8929 Other chronic pain: Secondary | ICD-10-CM

## 2020-09-01 DIAGNOSIS — M25511 Pain in right shoulder: Secondary | ICD-10-CM

## 2020-09-01 NOTE — Therapy (Signed)
[facility] Physical Therapy [address] [place], [place], [address] Phone: [phone]   Fax:  [phone]  Physical Therapy Evaluation  Patient Details  Name: Carrie [name] MRN: [id number] Date of Birth: 27-Oct-1955 Referring Provider (PT): Eldred Manges, MD   Encounter Date: 09/01/2020   PT End of Session - 09/01/20 1001     Visit Number 1    Number of Visits 6    Date for PT Re-Evaluation 10/13/20    PT Start Time 0930    PT Stop Time 1000    PT Time Calculation (min) 30 min    Activity Tolerance Patient tolerated treatment well    Behavior During Therapy [facility] for tasks assessed/performed             Past Medical History:  Diagnosis Date   Clostridium difficile diarrhea    Fibromyalgia    OSA on CPAP    Osteomyelitis of toe [facility])     Past Surgical History:  Procedure Laterality Date   ANTERIOR CERVICAL DECOMP/DISCECTOMY FUSION N/A 10/14/2018   Procedure: CERVICAL FIVE-  CERVICAL SIX ANTERIOR CERVICAL DECOMPRESSION/DISCECTOMY FUSION, ALLOGRAFT PLATE;  Surgeon: Eldred Manges, MD;  Location: [facility] OR;  Service: Orthopedics;  Laterality: N/A;   biopsy of toe Right    BLADDER SUSPENSION     PERIPHERALLY INSERTED CENTRAL CATHETER INSERTION  11/2013   TEMPOROMANDIBULAR JOINT ARTHROPLASTY Bilateral    TMJ ARTHROPLASTY  1988   TOE DEBRIDEMENT Right    VAGINAL HYSTERECTOMY      There were no vitals filed for this visit.    Subjective Assessment - 09/01/20 0930     Subjective Pt is a 64 y/o female who returns to [facility]T for Rt sided shoulder and upper back pain.  She was seen at this clinic in early 2021 for same issue.  She reports crunches and any pull up activiity will aggravate the Rt shoulder.  About 6 weeks ago is when this area began to flare up again.    Patient Stated Goals improve pain in Rt shoulder    Currently in Pain? Yes    Pain Score 6     Pain Location Shoulder    Pain Orientation Right    Pain Descriptors / Indicators  Aching;Constant;Tightness    Pain Type Acute pain    Pain Onset More than a month ago    Pain Frequency Constant    Aggravating Factors  feels spasm with exercise    Pain Relieving Factors heat, ice                [facility] PT Assessment - 09/01/20 0935       Assessment   Medical Diagnosis M25.511 (ICD-10-CM) - Right shoulder pain, unspecified chronicity  Z98.1 (ICD-10-CM) - S/P cervical spinal fusion  M54.2 (ICD-10-CM) - Neck pain    Referring Provider (PT) Eldred Manges, MD    Onset Date/Surgical Date --   6 weeks ago   Hand Dominance Right    Next MD Visit PRN    Prior Therapy early 2021      Precautions   Precautions None      Restrictions   Weight Bearing Restrictions No      Balance Screen   Has the patient fallen in the past 6 months No    Has the patient had a decrease in activity level because of a fear of falling?  No    Is the patient reluctant to leave their home because of a fear of falling?  No      Observation/Other Assessments   Focus on Therapeutic Outcomes (FOTO)  59 (predicted 64)      Posture/Postural Control   Posture/Postural Control Postural limitations    Postural Limitations Rounded Shoulders;Forward head      ROM / Strength   AROM / PROM / Strength AROM;Strength      AROM   AROM Assessment Site Cervical    Cervical Flexion 42    Cervical Extension 47   with pain   Cervical - Right Side Bend 24    Cervical - Left Side Bend 18    Cervical - Right Rotation limited 25%    Cervical - Left Rotation limited 25%      Strength   Strength Assessment Site Shoulder    Right/Left Shoulder Right;Left    Right Shoulder Flexion 3/5    Right Shoulder ABduction 3/5    Left Shoulder Flexion 3+/5    Left Shoulder ABduction 4/5      Palpation   Palpation comment trigger points in UT/LS and infraspinatus                        Objective measurements completed on examination: See above findings.       [facility] Adult PT  Treatment/Exercise - 09/01/20 0935       Exercises   Exercises Other Exercises    Other Exercises  provided with old HEP, will review next visit      Manual Therapy   Manual therapy comments STM with compression to Rt infraspinatus, LS and upper trap              Trigger Point Dry Needling - 09/01/20 1000     Consent Given? Yes    Education Handout Provided Yes    Muscles Treated Head and Neck Levator scapulae;Upper trapezius    Muscles Treated Upper Quadrant Infraspinatus    Upper Trapezius Response Twitch reponse elicited    Levator Scapulae Response Twitch response elicited    Infraspinatus Response Twitch response elicited                  PT Education - 09/01/20 1000     Education Details HEP, DN    Person(s) Educated Patient    Methods Explanation;Handout    Comprehension Verbalized understanding              PT Short Term Goals - 09/01/20 1129       PT SHORT TERM GOAL #1   Title independent with initial HEP    Time 3    Period Weeks    Status New    Target Date 09/22/20               PT Long Term Goals - 09/01/20 1129       PT LONG TERM GOAL #1   Title independent with HEP    Time 6    Period Weeks    Status New    Target Date 10/13/20      PT LONG TERM GOAL #2   Title report pain <3/10 with activity for improved function    Time 6    Period Weeks    Status New    Target Date 10/13/20      PT LONG TERM GOAL #3   Title FOTO score improved to 64 for improved function    Time 6    Period Weeks    Status New    Target  Date 10/13/20      PT LONG TERM GOAL #4   Title demonstrate 4/5 bil shoulder strength for improved function    Time 6    Period Weeks    Status New    Target Date 10/13/20                    Plan - 09/01/20 1001     Clinical Impression Statement Pt is a 65 y/o female who presents to [facility]T for acute onset of Rt sided shoulder, neck and upper back pain.  She had episode of similar pain approx  18 months ago with positive response with PT.  She demonstrates active trigger points with positive response to DN.  Will benefit from PT to address deficits listed.    Personal Factors and Comorbidities Comorbidity 3+    Comorbidities cholecystectomy, ACDF, fibromyalgia    Examination-Activity Limitations Lift;Reach Overhead    Examination-Participation Restrictions Laundry;Community Activity;Cleaning    Stability/Clinical Decision Making Stable/Uncomplicated    Clinical Decision Making Low    Rehab Potential Good    PT Frequency 1x / week    PT Duration 6 weeks    PT Treatment/Interventions ADLs/Self Care Home Management;Cryotherapy;Electrical Stimulation;Moist Heat;Therapeutic exercise;Therapeutic activities;Functional mobility training;Neuromuscular re-education;Patient/family education;Manual techniques;Taping;Dry needling    PT Next Visit Plan assess response to DN, review HEP and add shoulder exercises    PT Home Exercise Plan Access Code: [email or web address]    Consulted and Agree with Plan of Care Patient             Patient will benefit from skilled therapeutic intervention in order to improve the following deficits and impairments:  Pain, Increased fascial restricitons, Increased muscle spasms, Decreased strength, Postural dysfunction  Visit Diagnosis: Chronic right shoulder pain - Plan: PT plan of care cert/re-cert  Abnormal posture - Plan: PT plan of care cert/re-cert  Muscle weakness (generalized) - Plan: PT plan of care cert/re-cert  Other symptoms and signs involving the musculoskeletal system - Plan: PT plan of care cert/re-cert     Problem List Patient Active Problem List   Diagnosis Date Noted   S/P cervical spinal fusion 01/08/2019   Other spondylosis with radiculopathy, cervical region    Recurrent Clostridium difficile diarrhea 02/17/2014   Enteritis due to Clostridium difficile    Osteomyelitis (HCC) 12/10/2013   Fever 12/10/2013   Sepsis (HCC) 12/10/2013    Obstructive sleep apnea 12/10/2013   Acute osteomyelitis of ankle and foot (HCC) 11/06/2013      Clarita Crane, PT, DPT 09/01/20 11:34 AM     [facility] Physical Therapy [address] [place], [place], [address] Phone: [phone]   Fax:  [phone]  Name: Carrie [name] MRN: [id number] Date of Birth: 10-24-1955

## 2020-09-01 NOTE — Patient Instructions (Signed)
Access Code: [email or web address] URL: [email or web address] Date: 09/01/2020 Prepared by: Moshe Cipro  Exercises Doorway Pec Stretch at 90 Degrees Abduction - 1 x daily - 7 x weekly - 3 reps - 1 sets - 30 sec hold Supine Thoracic Extension on Swiss Ball - 1 x daily - 7 x weekly - 3 reps - 1 sets - 10-20 sec hold Prone Middle Trapezius Strengthening on Swiss Ball - 1 x daily - 7 x weekly - 10 reps - 3 sets - 1-2 sec hold Prone Shoulder Extension on Swiss Ball - 1 x daily - 7 x weekly - 10 reps - 3 sets - 1-2 sec hold Standing Bent Over Single Arm Shoulder Row with Dumbbells - 1 x daily - 7 x weekly - 10 reps - 3 sets - 1-2 sec hold  Patient Education Trigger Point Dry Needling

## 2020-09-08 ENCOUNTER — Other Ambulatory Visit: Payer: Self-pay

## 2020-09-08 ENCOUNTER — Ambulatory Visit: Payer: BC Managed Care – PPO | Admitting: Physical Therapy

## 2020-09-08 ENCOUNTER — Encounter: Payer: Self-pay | Admitting: Physical Therapy

## 2020-09-08 DIAGNOSIS — R29898 Other symptoms and signs involving the musculoskeletal system: Secondary | ICD-10-CM

## 2020-09-08 DIAGNOSIS — R293 Abnormal posture: Secondary | ICD-10-CM | POA: Diagnosis not present

## 2020-09-08 DIAGNOSIS — M6281 Muscle weakness (generalized): Secondary | ICD-10-CM

## 2020-09-08 DIAGNOSIS — M25511 Pain in right shoulder: Secondary | ICD-10-CM

## 2020-09-08 DIAGNOSIS — G8929 Other chronic pain: Secondary | ICD-10-CM

## 2020-09-08 NOTE — Therapy (Signed)
[facility] Physical Therapy [address] [place], [place], [address] Phone: [phone]   Fax:  [phone]  Physical Therapy Treatment  Patient Details  Name: Carrie [name] MRN: [id number] Date of Birth: 12-05-1955 Referring Provider (PT): Eldred Manges, MD   Encounter Date: 09/08/2020   PT End of Session - 09/08/20 0914     Visit Number 2    Number of Visits 6    Date for PT Re-Evaluation 10/13/20    PT Start Time 0845    PT Stop Time 0910    PT Time Calculation (min) 25 min    Activity Tolerance Patient tolerated treatment well    Behavior During Therapy [facility] for tasks assessed/performed             Past Medical History:  Diagnosis Date   Clostridium difficile diarrhea    Fibromyalgia    OSA on CPAP    Osteomyelitis of toe [facility])     Past Surgical History:  Procedure Laterality Date   ANTERIOR CERVICAL DECOMP/DISCECTOMY FUSION N/A 10/14/2018   Procedure: CERVICAL FIVE-  CERVICAL SIX ANTERIOR CERVICAL DECOMPRESSION/DISCECTOMY FUSION, ALLOGRAFT PLATE;  Surgeon: Eldred Manges, MD;  Location: [facility] OR;  Service: Orthopedics;  Laterality: N/A;   biopsy of toe Right    BLADDER SUSPENSION     PERIPHERALLY INSERTED CENTRAL CATHETER INSERTION  11/2013   TEMPOROMANDIBULAR JOINT ARTHROPLASTY Bilateral    TMJ ARTHROPLASTY  1988   TOE DEBRIDEMENT Right    VAGINAL HYSTERECTOMY      There were no vitals filed for this visit.   Subjective Assessment - 09/08/20 0843     Subjective doing well until yesterday - was using neck and shoulders a lot and so it feels a little more sore    Patient Stated Goals improve pain in Rt shoulder    Currently in Pain? Yes    Pain Score 7     Pain Location Shoulder    Pain Orientation Right    Pain Descriptors / Indicators Aching;Contraction;Tightness    Pain Type Acute pain    Pain Onset More than a month ago    Pain Frequency Constant    Aggravating Factors  spasms with exercise    Pain Relieving Factors heat, ice                                [facility] Adult PT Treatment/Exercise - 09/08/20 0902       Exercises   Other Exercises  discussed current HEP and gym program, pt with good understanding of HEP and other gym exercises      Manual Therapy   Manual therapy comments STM with compression to Rt infraspinatus, LS and upper trap              Trigger Point Dry Needling - 09/08/20 0902     Consent Given? Yes    Education Handout Provided Previously provided    Muscles Treated Head and Neck Levator scapulae;Upper trapezius;Semispinalis capitus;Splenius capitus    Upper Trapezius Response Twitch reponse elicited    Levator Scapulae Response Twitch response elicited    Splenius capitus Response Twitch reponse elicited    Semispinalis capitus Response Twitch reponse elicited                    PT Short Term Goals - 09/08/20 0914       PT SHORT TERM GOAL #1   Title independent with initial HEP  Time 3    Period Weeks    Status Achieved    Target Date 09/22/20               PT Long Term Goals - 09/08/20 0914       PT LONG TERM GOAL #1   Title independent with HEP    Time 6    Period Weeks    Status On-going    Target Date 10/13/20      PT LONG TERM GOAL #2   Title report pain <3/10 with activity for improved function    Time 6    Period Weeks    Status On-going    Target Date 10/13/20      PT LONG TERM GOAL #3   Title FOTO score improved to 64 for improved function    Time 6    Period Weeks    Status On-going    Target Date 10/13/20      PT LONG TERM GOAL #4   Title demonstrate 4/5 bil shoulder strength for improved function    Time 6    Period Weeks    Status On-going    Target Date 10/13/20                   Plan - 09/08/20 0914     Clinical Impression Statement Pt tolerated session well today meeting STG #1 with good understanding of initial HEP and gym program.  She had a positive response to DN and manual  therapy initially with return of pain yesterday.  She does report last week she was on vacation and has now returned to regular routine likely causing increase in symptoms.  Feel symptoms should improve with consistent HEP and regular exercise.    Personal Factors and Comorbidities Comorbidity 3+    Comorbidities cholecystectomy, ACDF, fibromyalgia    Examination-Activity Limitations Lift;Reach Overhead    Examination-Participation Restrictions Laundry;Community Activity;Cleaning    Stability/Clinical Decision Making Stable/Uncomplicated    Rehab Potential Good    PT Frequency 1x / week    PT Duration 6 weeks    PT Treatment/Interventions ADLs/Self Care Home Management;Cryotherapy;Electrical Stimulation;Moist Heat;Therapeutic exercise;Therapeutic activities;Functional mobility training;Neuromuscular re-education;Patient/family education;Manual techniques;Taping;Dry needling    PT Next Visit Plan assess response to DN, update HEP PRN, continue with manual/DN PRN    PT Home Exercise Plan Access Code: [email or web address]    Consulted and Agree with Plan of Care Patient             Patient will benefit from skilled therapeutic intervention in order to improve the following deficits and impairments:  Pain, Increased fascial restricitons, Increased muscle spasms, Decreased strength, Postural dysfunction  Visit Diagnosis: Chronic right shoulder pain  Abnormal posture  Muscle weakness (generalized)  Other symptoms and signs involving the musculoskeletal system     Problem List Patient Active Problem List   Diagnosis Date Noted   S/P cervical spinal fusion 01/08/2019   Other spondylosis with radiculopathy, cervical region    Recurrent Clostridium difficile diarrhea 02/17/2014   Enteritis due to Clostridium difficile    Osteomyelitis (HCC) 12/10/2013   Fever 12/10/2013   Sepsis (HCC) 12/10/2013   Obstructive sleep apnea 12/10/2013   Acute osteomyelitis of ankle and foot (HCC) 11/06/2013       Carrie [name], PT, DPT 09/08/20 9:17 AM     [facility] Physical Therapy [address] [place], [place], [address] Phone: [phone]   Fax:  [phone]  Name: Carrie [name] MRN: [id number] Date of Birth:  09/12/1955    

## 2020-09-15 ENCOUNTER — Other Ambulatory Visit: Payer: Self-pay

## 2020-09-15 ENCOUNTER — Encounter: Payer: Self-pay | Admitting: Physical Therapy

## 2020-09-15 ENCOUNTER — Ambulatory Visit: Payer: BC Managed Care – PPO | Admitting: Physical Therapy

## 2020-09-15 DIAGNOSIS — R293 Abnormal posture: Secondary | ICD-10-CM | POA: Diagnosis not present

## 2020-09-15 DIAGNOSIS — M25511 Pain in right shoulder: Secondary | ICD-10-CM

## 2020-09-15 DIAGNOSIS — R29898 Other symptoms and signs involving the musculoskeletal system: Secondary | ICD-10-CM

## 2020-09-15 DIAGNOSIS — G8929 Other chronic pain: Secondary | ICD-10-CM

## 2020-09-15 DIAGNOSIS — M6281 Muscle weakness (generalized): Secondary | ICD-10-CM | POA: Diagnosis not present

## 2020-09-15 NOTE — Therapy (Addendum)
[facility] Physical Therapy [address] [place], [place], [address] Phone: [phone]   Fax:  [phone]  Physical Therapy Treatment /Discharge  Patient Details  Name: Carrie [name] MRN: [id number] Date of Birth: 1955/11/27 Referring Provider (PT): Marybelle Killings, MD   Encounter Date: 09/15/2020   PT End of Session - 09/15/20 0918     Visit Number 3    Number of Visits 6    Date for PT Re-Evaluation 10/13/20    PT Start Time 0845    PT Stop Time 0910    PT Time Calculation (min) 25 min    Activity Tolerance Patient tolerated treatment well    Behavior During Therapy [facility] for tasks assessed/performed             Past Medical History:  Diagnosis Date   Clostridium difficile diarrhea    Fibromyalgia    OSA on CPAP    Osteomyelitis of toe [facility])     Past Surgical History:  Procedure Laterality Date   ANTERIOR CERVICAL DECOMP/DISCECTOMY FUSION N/A 10/14/2018   Procedure: CERVICAL FIVE-  CERVICAL SIX ANTERIOR CERVICAL DECOMPRESSION/DISCECTOMY FUSION, ALLOGRAFT PLATE;  Surgeon: Marybelle Killings, MD;  Location: [place];  Service: Orthopedics;  Laterality: N/A;   biopsy of toe Right    BLADDER SUSPENSION     PERIPHERALLY INSERTED CENTRAL CATHETER INSERTION  11/2013   TEMPOROMANDIBULAR JOINT ARTHROPLASTY Bilateral    TMJ ARTHROPLASTY  1988   TOE DEBRIDEMENT Right    VAGINAL HYSTERECTOMY      There were no vitals filed for this visit.   Subjective Assessment - 09/15/20 0845     Subjective feels better, has some low back stiffness in the AM, feels like arthritis.  has been back to the gym and that is going well.    Patient Stated Goals improve pain in Rt shoulder    Currently in Pain? Yes    Pain Score 1     Pain Location Shoulder    Pain Orientation Right    Pain Descriptors / Indicators Aching;Spasm    Pain Type Acute pain    Pain Onset More than a month ago    Pain Frequency Constant    Aggravating Factors  spasms with exercise    Pain Relieving  Factors heat, ice, DN                               [facility] Adult PT Treatment/Exercise - 09/15/20 0917       Manual Therapy   Manual therapy comments STM with compression to Rt infraspinatus, LS and upper trap              Trigger Point Dry Needling - 09/15/20 0917     Consent Given? Yes    Education Handout Provided Previously provided    Muscles Treated Head and Neck Upper trapezius;Levator scapulae    Upper Trapezius Response Twitch reponse elicited    Levator Scapulae Response Twitch response elicited                    PT Short Term Goals - 09/08/20 0914       PT SHORT TERM GOAL #1   Title independent with initial HEP    Time 3    Period Weeks    Status Achieved    Target Date 09/22/20               PT Long Term Goals -  09/08/20 0914       PT LONG TERM GOAL #1   Title independent with HEP    Time 6    Period Weeks    Status On-going    Target Date 10/13/20      PT LONG TERM GOAL #2   Title report pain <3/10 with activity for improved function    Time 6    Period Weeks    Status On-going    Target Date 10/13/20      PT LONG TERM GOAL #3   Title FOTO score improved to 64 for improved function    Time 6    Period Weeks    Status On-going    Target Date 10/13/20      PT LONG TERM GOAL #4   Title demonstrate 4/5 bil shoulder strength for improved function    Time 6    Period Weeks    Status On-going    Target Date 10/13/20                   Plan - 09/15/20 0918     Clinical Impression Statement Pt today with significant reduction in pain but still with some active trigger points in UT and LS.  Addressed with manual therapy and DN today.  Anticipate nearing d/c at this time.    Personal Factors and Comorbidities Comorbidity 3+    Comorbidities cholecystectomy, ACDF, fibromyalgia    Examination-Activity Limitations Lift;Reach Overhead    Examination-Participation Restrictions Laundry;Community  Activity;Cleaning    Stability/Clinical Decision Making Stable/Uncomplicated    Rehab Potential Good    PT Frequency 1x / week    PT Duration 6 weeks    PT Treatment/Interventions ADLs/Self Care Home Management;Cryotherapy;Electrical Stimulation;Moist Heat;Therapeutic exercise;Therapeutic activities;Functional mobility training;Neuromuscular re-education;Patient/family education;Manual techniques;Taping;Dry needling    PT Next Visit Plan assess response to DN, update HEP PRN, continue with manual/DN PRN, possible d/c    PT Home Exercise Plan Access Code: [email or web address]    Consulted and Agree with Plan of Care Patient             Patient will benefit from skilled therapeutic intervention in order to improve the following deficits and impairments:  Pain, Increased fascial restricitons, Increased muscle spasms, Decreased strength, Postural dysfunction  Visit Diagnosis: Chronic right shoulder pain  Abnormal posture  Muscle weakness (generalized)  Other symptoms and signs involving the musculoskeletal system     Problem List Patient Active Problem List   Diagnosis Date Noted   S/P cervical spinal fusion 01/08/2019   Other spondylosis with radiculopathy, cervical region    Recurrent Clostridium difficile diarrhea 02/17/2014   Enteritis due to Clostridium difficile    Osteomyelitis ([place]) 12/10/2013   Fever 12/10/2013   Sepsis ([place]) 12/10/2013   Obstructive sleep apnea 12/10/2013   Acute osteomyelitis of ankle and foot ([place]) 11/06/2013      Laureen Abrahams, PT, DPT 09/15/20 9:19 AM  PHYSICAL THERAPY DISCHARGE SUMMARY  Visits from Start of Care: 3  Current functional level related to goals / functional outcomes: See note   Remaining deficits: See note   Education / Equipment: HEP   Patient agrees to discharge. Patient goals were partially met. Patient is being discharged due to not returning since the last visit. Scot Jun, PT, DPT, OCS, ATC 11/01/20  1:16  PM  w    [facility] Physical Therapy [address] [place], [place], [address] Phone: [phone]   Fax:  [phone]  Name: Carrie [name] MRN: [id number] Date of  Birth: January 12, 1956

## 2020-10-05 ENCOUNTER — Encounter: Payer: BC Managed Care – PPO | Admitting: Physical Therapy
